# Patient Record
Sex: Male | Born: 1955 | Race: White | Hispanic: No | Smoking: Never smoker
Health system: Southern US, Community
[De-identification: ages and names within clinical notes are randomized; demographics above are authoritative.]

## PROBLEM LIST (undated history)

## (undated) DIAGNOSIS — M1990 Unspecified osteoarthritis, unspecified site: Secondary | ICD-10-CM

## (undated) DIAGNOSIS — E785 Hyperlipidemia, unspecified: Secondary | ICD-10-CM

## (undated) DIAGNOSIS — B20 Human immunodeficiency virus [HIV] disease: Secondary | ICD-10-CM

## (undated) DIAGNOSIS — I10 Essential (primary) hypertension: Secondary | ICD-10-CM

## (undated) DIAGNOSIS — Z21 Asymptomatic human immunodeficiency virus [HIV] infection status: Secondary | ICD-10-CM

## (undated) HISTORY — DX: Essential (primary) hypertension: I10

## (undated) HISTORY — DX: Hyperlipidemia, unspecified: E78.5

## (undated) HISTORY — PX: LUMBAR EPIDURAL INJECTION: SHX1980

## (undated) HISTORY — DX: Unspecified osteoarthritis, unspecified site: M19.90

## (undated) HISTORY — DX: Human immunodeficiency virus (HIV) disease: B20

## (undated) HISTORY — PX: WISDOM TOOTH EXTRACTION: SHX21

## (undated) HISTORY — DX: Asymptomatic human immunodeficiency virus (hiv) infection status: Z21

---

## 2000-01-01 ENCOUNTER — Ambulatory Visit (HOSPITAL_BASED_OUTPATIENT_CLINIC_OR_DEPARTMENT_OTHER): Admission: RE | Admit: 2000-01-01 | Discharge: 2000-01-01 | Payer: Self-pay | Admitting: Otolaryngology

## 2006-06-07 ENCOUNTER — Ambulatory Visit: Payer: Self-pay | Admitting: Family Medicine

## 2006-08-19 ENCOUNTER — Ambulatory Visit: Payer: Self-pay | Admitting: Family Medicine

## 2006-09-19 ENCOUNTER — Ambulatory Visit: Payer: Self-pay | Admitting: Family Medicine

## 2006-10-23 ENCOUNTER — Ambulatory Visit: Payer: Self-pay | Admitting: Family Medicine

## 2006-11-27 ENCOUNTER — Ambulatory Visit: Payer: Self-pay | Admitting: Family Medicine

## 2006-12-20 ENCOUNTER — Ambulatory Visit: Payer: Self-pay | Admitting: Internal Medicine

## 2007-05-05 ENCOUNTER — Ambulatory Visit: Payer: Self-pay | Admitting: Family Medicine

## 2007-11-03 ENCOUNTER — Ambulatory Visit: Payer: Self-pay | Admitting: Family Medicine

## 2008-05-04 ENCOUNTER — Ambulatory Visit: Payer: Self-pay | Admitting: Family Medicine

## 2008-06-03 ENCOUNTER — Ambulatory Visit: Payer: Self-pay | Admitting: Family Medicine

## 2008-06-03 ENCOUNTER — Encounter: Admission: RE | Admit: 2008-06-03 | Discharge: 2008-06-03 | Payer: Self-pay | Admitting: Family Medicine

## 2008-06-21 ENCOUNTER — Ambulatory Visit: Payer: Self-pay | Admitting: Internal Medicine

## 2008-07-05 ENCOUNTER — Ambulatory Visit: Payer: Self-pay | Admitting: Internal Medicine

## 2008-07-05 ENCOUNTER — Encounter: Payer: Self-pay | Admitting: Internal Medicine

## 2008-07-05 LAB — HM COLONOSCOPY: HM Colonoscopy: NORMAL

## 2008-07-07 ENCOUNTER — Encounter: Payer: Self-pay | Admitting: Internal Medicine

## 2009-06-07 ENCOUNTER — Ambulatory Visit: Payer: Self-pay | Admitting: Family Medicine

## 2009-06-09 ENCOUNTER — Ambulatory Visit: Payer: Self-pay | Admitting: Family Medicine

## 2009-07-11 ENCOUNTER — Ambulatory Visit: Payer: Self-pay | Admitting: Family Medicine

## 2009-11-28 ENCOUNTER — Ambulatory Visit: Payer: Self-pay | Admitting: Family Medicine

## 2009-12-01 ENCOUNTER — Ambulatory Visit: Payer: Self-pay | Admitting: Family Medicine

## 2009-12-05 ENCOUNTER — Emergency Department (HOSPITAL_COMMUNITY): Admission: EM | Admit: 2009-12-05 | Discharge: 2009-12-05 | Payer: Self-pay | Admitting: Emergency Medicine

## 2009-12-14 ENCOUNTER — Ambulatory Visit: Payer: Self-pay | Admitting: Family Medicine

## 2009-12-14 ENCOUNTER — Encounter: Payer: Self-pay | Admitting: Nurse Practitioner

## 2010-09-17 ENCOUNTER — Encounter: Payer: Self-pay | Admitting: Nurse Practitioner

## 2010-09-20 ENCOUNTER — Telehealth: Payer: Self-pay | Admitting: Internal Medicine

## 2010-09-20 ENCOUNTER — Encounter: Payer: Self-pay | Admitting: Nurse Practitioner

## 2010-09-20 ENCOUNTER — Ambulatory Visit: Payer: Self-pay | Admitting: Family Medicine

## 2010-09-21 ENCOUNTER — Ambulatory Visit: Payer: Self-pay | Admitting: Internal Medicine

## 2010-09-21 DIAGNOSIS — R143 Flatulence: Secondary | ICD-10-CM

## 2010-09-21 DIAGNOSIS — R142 Eructation: Secondary | ICD-10-CM

## 2010-09-21 DIAGNOSIS — D126 Benign neoplasm of colon, unspecified: Secondary | ICD-10-CM | POA: Insufficient documentation

## 2010-09-21 DIAGNOSIS — R141 Gas pain: Secondary | ICD-10-CM | POA: Insufficient documentation

## 2010-09-21 DIAGNOSIS — D649 Anemia, unspecified: Secondary | ICD-10-CM | POA: Insufficient documentation

## 2010-09-21 DIAGNOSIS — Z85038 Personal history of other malignant neoplasm of large intestine: Secondary | ICD-10-CM | POA: Insufficient documentation

## 2010-09-21 DIAGNOSIS — R21 Rash and other nonspecific skin eruption: Secondary | ICD-10-CM | POA: Insufficient documentation

## 2010-09-21 DIAGNOSIS — I10 Essential (primary) hypertension: Secondary | ICD-10-CM | POA: Insufficient documentation

## 2010-09-21 DIAGNOSIS — K649 Unspecified hemorrhoids: Secondary | ICD-10-CM | POA: Insufficient documentation

## 2010-09-21 DIAGNOSIS — N3010 Interstitial cystitis (chronic) without hematuria: Secondary | ICD-10-CM | POA: Insufficient documentation

## 2010-09-21 DIAGNOSIS — R195 Other fecal abnormalities: Secondary | ICD-10-CM | POA: Insufficient documentation

## 2010-09-25 ENCOUNTER — Ambulatory Visit: Payer: Self-pay | Admitting: Internal Medicine

## 2010-09-25 DIAGNOSIS — K2900 Acute gastritis without bleeding: Secondary | ICD-10-CM | POA: Insufficient documentation

## 2010-09-25 LAB — CONVERTED CEMR LAB
Basophils Absolute: 0 10*3/uL (ref 0.0–0.1)
Basophils Relative: 0.5 % (ref 0.0–3.0)
Eosinophils Absolute: 0.3 10*3/uL (ref 0.0–0.7)
Eosinophils Relative: 6.9 % — ABNORMAL HIGH (ref 0.0–5.0)
Ferritin: 302.3 ng/mL (ref 22.0–322.0)
Folate: 13.7 ng/mL
HCT: 27.2 % — ABNORMAL LOW (ref 39.0–52.0)
Hemoglobin: 9.5 g/dL — ABNORMAL LOW (ref 13.0–17.0)
Iron: 56 ug/dL (ref 42–165)
Lymphocytes Relative: 21 % (ref 12.0–46.0)
Lymphs Abs: 1 10*3/uL (ref 0.7–4.0)
MCHC: 34.9 g/dL (ref 30.0–36.0)
MCV: 86.8 fL (ref 78.0–100.0)
Monocytes Absolute: 0.5 10*3/uL (ref 0.1–1.0)
Monocytes Relative: 10.5 % (ref 3.0–12.0)
Neutro Abs: 2.9 10*3/uL (ref 1.4–7.7)
Neutrophils Relative %: 61.1 % (ref 43.0–77.0)
Platelets: 170 10*3/uL (ref 150.0–400.0)
RBC: 3.14 M/uL — ABNORMAL LOW (ref 4.22–5.81)
RDW: 14.6 % (ref 11.5–14.6)
UREASE: NEGATIVE
Vitamin B-12: 311 pg/mL (ref 211–911)
WBC: 4.7 10*3/uL (ref 4.5–10.5)

## 2010-09-29 ENCOUNTER — Encounter (INDEPENDENT_AMBULATORY_CARE_PROVIDER_SITE_OTHER): Payer: Self-pay | Admitting: *Deleted

## 2010-10-02 ENCOUNTER — Ambulatory Visit: Payer: Self-pay | Admitting: Internal Medicine

## 2010-10-10 ENCOUNTER — Ambulatory Visit: Payer: Self-pay | Admitting: Internal Medicine

## 2010-10-11 ENCOUNTER — Ambulatory Visit: Payer: Self-pay | Admitting: Oncology

## 2010-10-11 ENCOUNTER — Ambulatory Visit: Payer: Self-pay | Admitting: Family Medicine

## 2010-10-25 ENCOUNTER — Telehealth: Payer: Self-pay | Admitting: Internal Medicine

## 2010-12-14 NOTE — Miscellaneous (Signed)
Summary: labs  Clinical Lists Changes  Orders: Added new Test order of TLB-B12, Serum-Total ONLY [phone]) - Signed Added new Test order of TLB-Ferritin (82728-FER) - Signed Added new Test order of TLB-Folic Acid (Folate) (82746-FOL) - Signed Added new Test order of TLB-Iron, (Fe) Total (83540-FE) - Signed Added new Test order of TLB-CBC Platelet - w/Differential (85025-CBCD) - Signed

## 2010-12-14 NOTE — Procedures (Signed)
Summary: Colonoscopy  Patient: Dale Watkins Note: All result statuses are Final unless otherwise noted.  Tests: (1) Colonoscopy (COL)   COL Colonoscopy           DONE     [facility]     [address]     [place], [place]  [address]           COLONOSCOPY PROCEDURE REPORT           PATIENT:  Dale Watkins, Dale Watkins  MR#:  [id number]     BIRTHDATE:  1955/11/26, 54 yrs. old  GENDER:  male     ENDOSCOPIST:  Wilhemina Bonito. Eda Keys, MD     REF. BY:  Office     PROCEDURE DATE:  10/10/2010     PROCEDURE:  Diagnostic Colonoscopy     ASA CLASS:  Class II     INDICATIONS:  Anemia, heme positive stool, history of     pre-cancerous (adenomatous) colon polyps ; index colonoscopy     06-2008 w/ small T.A. RECENT LABS WITH HG 9.5 (MCV 86.8).  NORMAL     B12,FOLATE, AND IRON STUIES (FERRITIN LEVEL 302). ALSO ESR     99...Marland KitchenRECENT EGD W/ EROSIVE GASTRITIS     MEDICATIONS:   Fentanyl 100 mcg IV, Versed 9 mg IV, Benadryl 12.5     mg IV           DESCRIPTION OF PROCEDURE:   After the risks benefits and     alternatives of the procedure were thoroughly explained, informed     consent was obtained.  Digital rectal exam was performed and     revealed no abnormalities.   The LB CF-H180AL [id number] endoscope     was introduced through the anus and advanced to the cecum, which     was identified by both the appendix and ileocecal valve, without     limitations. Time to cecum = 1:52 min.The quality of the prep was     excellent, using MoviPrep.  The instrument was then slowly     withdrawn (time = 6:00 min) as the colon was fully examined.     <<PROCEDUREIMAGES>>           FINDINGS:  A normal appearing cecum, ileocecal valve, and     appendiceal orifice were identified. The ascending, hepatic     flexure, transverse, splenic flexure, descending, sigmoid colon,     and rectum appeared unremarkable.  The terminal ileum was deeply     intubated and appeared normal.   Retroflexed views in the rectum     revealed no  abnormalities.    The scope was then withdrawn from     the patient and the procedure completed.           COMPLICATIONS:  None     ENDOSCOPIC IMPRESSION:     1) Normal colon     2) Normal terminal ileum     3) NOT LIKELY THAT THE DEGREE OF ANEMIA EXPLAINED BY GI BLOOD LOSS           RECOMMENDATIONS:     1) Follow up colonoscopy in 5 years (Hx adenoma 2009)     2) MY OFFICE TO ARRANGE HEMATOLOGY EVALUATION " PERSISTING     NORMOCYTIC ANEMIA OF UNCERTAIN CAUSE"     3) RESUME GENERAL MEDICAL CARE W/ DR Susann Givens           ______________________________     Wilhemina Bonito. Eda Keys, MD  CC:  Sharlot Gowda, MD; The Patient           n.     eSIGNED:   Wilhemina Bonito. Eda Keys at 10/10/2010 09:36 AM           Allene Dillon, [id number]  Note: An exclamation mark (!) indicates a result that was not dispersed into the flowsheet. Document Creation Date: 10/10/2010 9:37 AM _______________________________________________________________________  (1) Order result status: Final Collection or observation date-time: 10/10/2010 09:25 Requested date-time:  Receipt date-time:  Reported date-time:  Referring Physician:   Ordering Physician: Fransico Setters [phone]) Specimen Source:  Source: Launa Grill Order Number: [id number] Lab site:   Appended Document: Colonoscopy    Clinical Lists Changes  Observations: Added new observation of COLONNXTDUE: 09/2015 (10/10/2010 13:23)

## 2010-12-14 NOTE — Miscellaneous (Signed)
Summary: previsit prep/rm  Clinical Lists Changes  Medications: Added new medication of MOVIPREP 100 GM  SOLR (PEG-KCL-NACL-NASULF-NA ASC-C) As per prep instructions. - Signed Rx of MOVIPREP 100 GM  SOLR (PEG-KCL-NACL-NASULF-NA ASC-C) As per prep instructions.;  #1 x 0;  Signed;  Entered by: Sherren Kerns RN;  Authorized by: Hilarie Fredrickson MD;  Method used: Electronically to [facility] [address], [place], [place]  [address], Ph: [phone], Fax: [phone] Observations: Added new observation of ALLERGY REV: Done (10/02/2010 10:09)    Prescriptions: MOVIPREP 100 GM  SOLR (PEG-KCL-NACL-NASULF-NA ASC-C) As per prep instructions.  #1 x 0   Entered by:   Sherren Kerns RN   Authorized by:   Hilarie Fredrickson MD   Signed by:   Sherren Kerns RN on 10/02/2010   Method used:   Electronically to        [facility]* (retail)       [address]       [place], [place]  [address]       Ph: [phone]       Fax: [phone]   RxID:   [id number]

## 2010-12-14 NOTE — Letter (Signed)
Summary: EGD Instructions  [facility]  [address] [place], [place] [address]   Phone: [phone]  Fax: [phone]       Dale Watkins    July 01, 1956    MRN: [id number]       Procedure Day /Date: 09-25-10     Arrival Time: 9:30 AM      Procedure Time:10:30 AM     Location of Procedure:                    X      [facility] (4th Floor)   PREPARATION FOR ENDOSCOPY   On 09-25-10 THE DAY OF THE PROCEDURE:  1.   No solid foods, milk or milk products are allowed after midnight the night before your procedure.  2.   Do not drink anything colored red or purple.  Avoid juices with pulp.  No orange juice.  3.  You may drink clear liquids until 8:30 AM  which is 2 hours before your procedure.                                                                                                CLEAR LIQUIDS INCLUDE: Water Jello Ice Popsicles Tea (sugar ok, no milk/cream) Powdered fruit flavored drinks Coffee (sugar ok, no milk/cream) Gatorade Juice: apple, white grape, white cranberry  Lemonade Clear bullion, consomm, broth Carbonated beverages (any kind) Strained chicken noodle soup Hard Candy   MEDICATION INSTRUCTIONS  Unless otherwise instructed, you should take regular prescription medications with a small sip of water as early as possible the morning of your procedure.            OTHER INSTRUCTIONS  You will need a responsible adult at least 54 years of age to accompany you and drive you home.   This person must remain in the waiting room during your procedure.  Wear loose fitting clothing that is easily removed.  Leave jewelry and other valuables at home.  However, you may wish to bring a book to read or an iPod/MP3 player to listen to music as you wait for your procedure to start.  Remove all body piercing jewelry and leave at home.  Total time from sign-in until discharge is approximately 2-3 hours.  You should go home directly  after your procedure and rest.  You can resume normal activities the day after your procedure.  The day of your procedure you should not:   Drive   Make legal decisions   Operate machinery   Drink alcohol   Return to work  You will receive specific instructions about eating, activities and medications before you leave.    The above instructions have been reviewed and explained to me by   _______________________    I fully understand and can verbalize these instructions _____________________________ Date _________

## 2010-12-14 NOTE — Miscellaneous (Signed)
Summary: Orders Update clo  Clinical Lists Changes  Problems: Added new problem of GASTRITIS, ACUTE W/O HEMORRHAGE (ICD-535.00) Orders: Added new Test order of TLB-H Pylori Screen Gastric Biopsy (83013-CLOTEST) - Signed

## 2010-12-14 NOTE — Letter (Signed)
Summary: [facility] Instructions  [facility]  [address] [place], [place] [address]   Phone: [phone]  Fax: [phone]       Dale Watkins    [birth year]    MRN: [id number]        Procedure Day Dorna Bloom:  Jake Shark  10/10/10     Arrival Time:  8:00AM     Procedure Time:  9:00AM     Location of Procedure:                    Juliann Pares  [facility] (4th Floor)                      PREPARATION FOR COLONOSCOPY WITH MOVIPREP   Starting 5 days prior to your procedure 10/05/10 do not eat nuts, seeds, popcorn, corn, beans, peas,  salads, or any raw vegetables.  Do not take any fiber supplements (e.g. Metamucil, Citrucel, and Benefiber).  THE DAY BEFORE YOUR PROCEDURE         DATE: 10/09/10  DAY: MONDAY  1.  Drink clear liquids the entire day-NO SOLID FOOD  2.  Do not drink anything colored red or purple.  Avoid juices with pulp.  No orange juice.  3.  Drink at least 64 oz. (8 glasses) of fluid/clear liquids during the day to prevent dehydration and help the prep work efficiently.  CLEAR LIQUIDS INCLUDE: Water Jello Ice Popsicles Tea (sugar ok, no milk/cream) Powdered fruit flavored drinks Coffee (sugar ok, no milk/cream) Gatorade Juice: apple, white grape, white cranberry  Lemonade Clear bullion, consomm, broth Carbonated beverages (any kind) Strained chicken noodle soup Hard Candy                             4.  In the morning, mix first dose of MoviPrep solution:    Empty 1 Pouch A and 1 Pouch B into the disposable container    Add lukewarm drinking water to the top line of the container. Mix to dissolve    Refrigerate (mixed solution should be used within 24 hrs)  5.  Begin drinking the prep at 5:00 p.m. The MoviPrep container is divided by 4 marks.   Every 15 minutes drink the solution down to the next mark (approximately 8 oz) until the full liter is complete.   6.  Follow completed prep with 16 oz of clear liquid of your choice (Nothing  red or purple).  Continue to drink clear liquids until bedtime.  7.  Before going to bed, mix second dose of MoviPrep solution:    Empty 1 Pouch A and 1 Pouch B into the disposable container    Add lukewarm drinking water to the top line of the container. Mix to dissolve    Refrigerate  THE DAY OF YOUR PROCEDURE      DATE: 10/10/10    DAY: TUESDAY  Beginning at 4:00AM (5 hours before procedure):         1. Every 15 minutes, drink the solution down to the next mark (approx 8 oz) until the full liter is complete.  2. Follow completed prep with 16 oz. of clear liquid of your choice.    3. You may drink clear liquids until 7:00AM (2 HOURS BEFORE PROCEDURE).   MEDICATION INSTRUCTIONS  Unless otherwise instructed, you should take regular prescription medications with a small sip of water   as early as possible the morning  of your procedure.           OTHER INSTRUCTIONS  You will need a responsible adult at least 54 years of age to accompany you and drive you home.   This person must remain in the waiting room during your procedure.  Wear loose fitting clothing that is easily removed.  Leave jewelry and other valuables at home.  However, you may wish to bring a book to read or  an iPod/MP3 player to listen to music as you wait for your procedure to start.  Remove all body piercing jewelry and leave at home.  Total time from sign-in until discharge is approximately 2-3 hours.  You should go home directly after your procedure and rest.  You can resume normal activities the  day after your procedure.  The day of your procedure you should not:   Drive   Make legal decisions   Operate machinery   Drink alcohol   Return to work  You will receive specific instructions about eating, activities and medications before you leave.    The above instructions have been reviewed and explained to me by   Sherren Kerns RN  October 02, 2010 10:23 AM    I fully understand  and can verbalize these instructions _____________________________ Date _________

## 2010-12-14 NOTE — Procedures (Signed)
Summary: Colonoscopy   Colonoscopy  Procedure date:  07/05/2008  Findings:      Location:  [facility].    Procedures Next Due Date:    Colonoscopy: 07/2013  Patient Name: Dale Watkins, Dale Watkins MRN:  Procedure Procedures: Colonoscopy CPT: [address].    with polypectomy. CPT: A3573898.  Personnel: Endoscopist: Dale Bonito. Marina Goodell, MD.  Referred By: Dale All Susann Givens, MD.  Exam Location: Exam performed in Outpatient Clinic. Outpatient  Patient Consent: Procedure, Alternatives, Risks and Benefits discussed, consent obtained, from patient. Consent was obtained by the RN.  Indications  Average Risk Screening Routine.  History  Current Medications: Patient is not currently taking Coumadin.  Pre-Exam Physical: Performed Jul 05, 2008. Cardio-pulmonary exam, Rectal exam, HEENT exam , Abdominal exam, Mental status exam WNL.  Comments: Pt. history reviewed/updated, physical exam performed prior to initiation of sedation?yes Exam Exam: Extent of exam reached: Cecum, extent intended: Cecum.  The cecum was identified by appendiceal orifice and IC valve. Patient position: on left side. Time to Cecum: 00:01:54. Time for Withdrawl: 00:10:40. Colon retroflexion performed. Images taken. ASA Classification: II. Tolerance: excellent.  Monitoring: Pulse and BP monitoring, Oximetry used. Supplemental O2 given.  Colon Prep Used Movi prep for colon prep. Prep results: excellent.  Sedation Meds: Patient assessed and found to be appropriate for moderate (conscious) sedation. Fentanyl 100 mcg. given IV. Versed 10 mg. given IV.  Findings NORMAL EXAM: Cecum to Rectum.  POLYP: Sigmoid Colon, Maximum size: 2 mm. Procedure:  snare without cautery, removed, retrieved, Polyp sent to pathology. ICD9: Colon Polyps: 211.3.   Assessment  Diagnoses: 211.3: Colon Polyps.  one.   Events  Unplanned Interventions: No intervention was required.  Unplanned Events: There were no complications. Plans  Disposition: After procedure patient sent to recovery. After recovery patient sent home.  Scheduling/Referral: Colonoscopy, to Dale Bonito. Marina Goodell, MD, in 5 years if polyp adenomatous; otherwise 10 years,     cc.   Dale Ruiz C. LaLonde,MD   REPORT OF SURGICAL PATHOLOGY   Case #: [id number] Patient Name: Dale Watkins Office Chart Number:  [id number]   MRN: [id number] Pathologist: Dale Gandy. Luisa Hart, MD DOB/Age  06/08/1956 (Age: 55)    Gender: M Date Taken:  07/05/2008 Date Received: 07/06/2008   FINAL DIAGNOSIS   ***MICROSCOPIC EXAMINATION AND DIAGNOSIS***   COLON, SIGMOID POLYP:  TUBULAR ADENOMA.  NO HIGH GRADE DYSPLASIA OR MALIGNANCY IDENTIFIED.   cc Date Reported:  07/07/2008     Dale Gandy. Luisa Hart, MD *** Electronically Signed Out By Dale Watkins ***   Clinical information Screening.  R/O adenoma (mj)   specimen(s) obtained Colon, polyp(s), sigmoid   Gross Description Received in formalin is a tan, soft tissue fragment that is submitted in toto.  Size:  0.2 cm   (GP:mw, 07/06/08)    mw/     Signed by Dale Fredrickson MD on 07/07/2008 at 1:46 PM  ________________________________________________________________________ recall colonoscopy in 5 years   Signed by Dale Fredrickson MD on 07/07/2008 at 1:46 PM  ________________________________________________________________________    July 07, 2008 MRN: [id number]    [facility] [place] [address] [place], [place]  [address]    Dear Mr. Dale Watkins,  I am pleased to inform you that the colon polyp(s) removed during your recent colonoscopy was (were) found to be benign (no cancer detected) upon pathologic examination.  I recommend you have a repeat colonoscopy examination in 5 years to look for recurrent polyps, as having colon polyps increases your risk for having recurrent polyps or even colon cancer  in the future.  Should you develop new or worsening symptoms of abdominal pain, bowel habit changes or bleeding from the rectum or  bowels, please schedule an evaluation with either your primary care physician or with me.  Additional information/recommendations:  _X._ No further action with gastroenterology is needed at this time. Please      follow-up with your primary care physician for your other healthcare      needs.  Please call us if you are having persistent problems or have questions about your condition that have not been fully answered at this time.  Sincerely,  Dale Fredrickson MD  This letter has been electronically signed by your physician.   Signed by Dale Fredrickson MD on 07/07/2008 at 1:46 PM   This report was created from the original endoscopy report, which was reviewed and signed by the above listed endoscopist.    ________________________________________________________________________

## 2010-12-14 NOTE — Miscellaneous (Signed)
Summary: GI PV  Clinical Lists Changes  Medications: Added new medication of MOVIPREP 100 GM  SOLR (PEG-KCL-NACL-NASULF-NA ASC-C) As per prep instructions. - Signed Rx of MOVIPREP 100 GM  SOLR (PEG-KCL-NACL-NASULF-NA ASC-C) As per prep instructions.;  #1 x 0;  Signed;  Entered by: Barton Fanny RN;  Authorized by: Hilarie Fredrickson MD;  Method used: Electronic Observations: Added new observation of NKA: T (06/21/2008 11:42)  Patient Information Guide reviewed and given to pt.  Prescriptions: MOVIPREP 100 GM  SOLR (PEG-KCL-NACL-NASULF-NA ASC-C) As per prep instructions.  #1 x 0   Entered by:   Barton Fanny RN   Authorized by:   Hilarie Fredrickson MD   Signed by:   Barton Fanny RN on 06/21/2008   Method used:   Electronically sent to ...       [facility]       [address]       [place], [place]  [address]       Ph: [phone]       Fax: [phone]   RxID:   [id number]

## 2010-12-14 NOTE — Letter (Signed)
Summary: 12-14-09 through 09-20-10 / [facility]  12-14-09 through 09-20-10 / [facility]   Imported By: Lennie Odor 09/29/2010 15:58:49  _____________________________________________________________________  External Attachment:    Type:   Image     Comment:   External Document

## 2010-12-14 NOTE — Progress Notes (Signed)
Summary: Triage  Phone Note From Other Clinic   Caller: [facility] @ Dr. Leanna Battles  [phone] Call For: Dr. Marina Goodell Summary of Call: Possible G.I bleed....requesting sooner appt. than next avail. Initial call taken by: Karna Christmas,  September 20, 2010 1:43 PM  Follow-up for Phone Call        Offered appt. with N.P. for tomorrow.She will consult  Dr.LaLonde and call back,  Teryl Lucy RN  September 20, 2010 3:02 PM Dr.Lalonde would like pt. seen tomorrow-given appt. with N.P. as Dr.Perry is supervising dr.Records will be faxed to [facility] in a.m. Follow-up by: Teryl Lucy RN,  September 20, 2010 3:45 PM

## 2010-12-14 NOTE — Assessment & Plan Note (Signed)
Summary: Poss. G.I. bleed (Dr. Marina Goodell Pt)   History of Present Illness Visit Type: Initial Consult Primary GI MD: Yancey Flemings MD Primary Provider: Ronnald Nian, MD Requesting Provider: Ronnald Nian, MD Chief Complaint: Heme positive stool, hemorrhoids, and bloating, Pt states that he has not seen any blood but has seen mucus a couple of times with BMs  History of Present Illness:   Patient is a 55 year old male seen Aug. 2009 by Dr. Marina Goodell for colon cancer screening. He is referred here for evaluation of anemia and heme positive stools. Recently treated with Prednisone for generalized rash, got better but rash recurred and patient treated with another round of Prednisone. Last weekend woke up with "rash" on his tongue. PCP's office was closed so patient went to Urgent Care. Apparently told he didn't have thrush. Labs done and showed ESR of 99 and hgb of  8.8. Hemoccult positive on exam. Patient saw PCP following Monday. Repeat hgb was 10.4. In Feb. of this year hgb was 15.5. Patient had the flu in January. He lost 20 pounds during that time but regained most of the weight. No abdominal pain, mild nausea for last two days.   GI Review of Systems    Reports nausea.      Denies abdominal pain, acid reflux, belching, bloating, chest pain, dysphagia with liquids, dysphagia with solids, heartburn, loss of appetite, vomiting, vomiting blood, weight loss, and  weight gain.      Reports heme positive stool.     Denies anal fissure, black tarry stools, change in bowel habit, constipation, diarrhea, diverticulosis, fecal incontinence, hemorrhoids, irritable bowel syndrome, jaundice, light color stool, liver problems, rectal bleeding, and  rectal pain. ++++++++++++++++++++++----------------------------   Current Medications (verified): 1)  Lisinopril 10 Mg Tabs (Lisinopril) .... One Tablet By Mouth Once Daily 2)  Ambien 10 Mg Tabs (Zolpidem Tartrate) .... One Tablet By Mouth Once Daily 3)  Triamcinolone  Acetonide 0.1 % Crea (Triamcinolone Acetonide) .... 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  Alcohol Use - no Daily Caffeine Use: 2 daily  Illicit Drug Use - no Smoking Status:  quit Drug Use:  no  Review of Systems       The patient complains of fatigue, itching, skin rash, swollen lymph glands, and voice change.  The patient denies allergy/sinus, anemia, anxiety-new, arthritis/joint pain, back pain, blood in urine, breast changes/lumps, change in vision, confusion, cough, coughing up blood, depression-new, fainting, fever, headaches-new, hearing problems, heart murmur, heart rhythm changes, menstrual pain, muscle pains/cramps, night sweats, nosebleeds, pregnancy symptoms, shortness of breath, sleeping problems, sore throat, swelling of feet/legs, thirst - excessive , urination - excessive , urination changes/pain, urine leakage, and vision changes.    Vital Signs:  Patient profile:   55 year old male Height:      68 inches Weight:      169 pounds BMI:     25.79 BSA:     1.90 Pulse rate:   88 / minute Pulse rhythm:   regular BP sitting:   128 / 64  (left arm) Cuff size:   regular  Vitals Entered By: Ok Anis CMA (September 21, 2010 2:15 PM)  Physical Exam  General:  Well developed, well nourished, no acute distress. Head:  Normocephalic and atraumatic. Eyes:  Conjunctiva pink, no icterus.  Mouth:  No oral lesions. Tongue moist.  Neck:  no obvious masses  Lungs:  Clear throughout to auscultation. Heart:  Regular rate and rhythm; no murmurs, rubs,  or bruits. Abdomen:  Abdomen soft, nontender,  nondistended. No obvious masses or hepatomegaly.Normal bowel sounds.  Rectal:  No external or internal lesions appreciated. Stool medium brown, heme positive Msk:  Symmetrical with no gross deformities. Normal posture. Extremities:  No palmar erythema, no edema.  Neurologic:  Alert and  oriented x4;  grossly normal neurologically. Skin:  Intact without significant lesions or rashes. Cervical Nodes:  No significant cervical adenopathy. Psych:  Alert and cooperative. Normal mood and affect.   Impression & Recommendations:  Problem # 1:  BLOOD IN STOOL, OCCULT (ICD-792.1) Assessment New Positive fecal occult blood with drop in hemoglobin from 15.5 to 10.4 sometime between Feb.and now. No GI symptoms. He does take Aleve, almost daily.  Patient is up to date on CRC screening.  Patient is at risk of erosive disease / PUD secondary to NSAID use. Patient looks fine today. Abdominal exam is benign. Stool, though heme positive, is brown. He has no chest pain, SOB or dizziness. For further evaluation the patient will be scheduled for an EGD with biopsies (if indicated).  The risks and benefits of the procedure, as well as alternatives were discussed with the patient and he agrees to proceed. Hold Aleve until results of EGD are known. Start daily PPI.  MAY NEED REPEAT COLONOSCOPY NOW IF EGD NOT DEFINITIVE  Orders: EGD (EGD)  Problem # 2:  ADENOMATOUS COLONIC POLYP (ICD-211.3) Assessment: Comment Only Next colonoscopy due 2014  Problem # 3:  SKIN RASH (ICD-782.1) Generalized rash, recent steroid course x2. Recently changed washing detergents but patient doesn't think that is the problem. Of note, patient has a ESR of 99.   Patient Instructions: 1)  We schedueld the Endoscopy with Dr. Yancey Flemings on 09-25-10. 2)  Directions and brochure provided. 3)  [facility] Patient Information Guide given to patient. 4)  We have given you samples of Nexium, take 1 capsule 30 min before  breakfast. 5)  Copy sent to : John C. Susann Givens, MD 6)  The medication list was reviewed and reconciled.  All changed / newly prescribed medications were explained.  A complete medication list was provided to the patient / caregiver.

## 2010-12-14 NOTE — Procedures (Signed)
Summary: Upper Endoscopy  Patient: Dale Watkins Note: All result statuses are Final unless otherwise noted.  Tests: (1) Upper Endoscopy (EGD)   EGD Upper Endoscopy       DONE     [facility]     [address]     [place], [place]  [address]           ENDOSCOPY PROCEDURE REPORT           PATIENT:  [name], [name]  MR#:  [id number]     BIRTHDATE:  08/26/1956, 53 yrs. old  GENDER:  male           ENDOSCOPIST:  Wilhemina Bonito. Eda Keys, MD     Referred by:  Office           PROCEDURE DATE:  09/25/2010     PROCEDURE:  EGD with biopsy, [id number]     ASA CLASS:  Class II     INDICATIONS:  anemia, hemoccult positive stool           MEDICATIONS:   Fentanyl 75 mcg IV, Versed 9 mg IV     TOPICAL ANESTHETIC:  Exactacain Spray           DESCRIPTION OF PROCEDURE:   After the risks benefits and     alternatives of the procedure were thoroughly explained, informed     consent was obtained.  The LB GIF-H180 [id number] endoscope was     introduced through the mouth and advanced to the third portion of     the duodenum, without limitations.  The instrument was slowly     withdrawn as the mucosa was fully examined.     <<PROCEDUREIMAGES>>           Mild Esophagitis was found in the distal esophagus.  Moderate     erosive gastritis was found in the body and the antrum of the     stomach. Clo bx taken. The duodenal bulb was normal in appearance,     as was the postbulbar duodenum to D3.    Retroflexed views     revealed no abnormalities.    The scope was then withdrawn from     the patient and the procedure completed.           COMPLICATIONS:  None           ENDOSCOPIC IMPRESSION:     1) Mild Esophagitis in the distal esophagus     2) Moderate erosive gastritis in the body and the antrum of the     stomach (possible cause for anemia and heme + stool)     3) Normal duodenum     RECOMMENDATIONS:     1) Continue Nexium     2) Avoid NSAIDS  if at all possible     3) My office will schedule a  colonoscopy to further evaluate     anemia and heme + stool     4) CBC, B12, Folate, Ferritin, Iron saturation           ______________________________     Wilhemina Bonito. Eda Keys, MD           CC:  Sharlot Gowda, MD, The Patient           n.     eSIGNED:   Wilhemina Bonito. Eda Keys at 09/25/2010 11:22 AM           Allene Dillon, [id number]  Note: An exclamation mark (!) indicates a result that  was not dispersed into the flowsheet. Document Creation Date: 09/25/2010 11:23 AM _______________________________________________________________________  (1) Order result status: Final Collection or observation date-time: 09/25/2010 11:13 Requested date-time:  Receipt date-time:  Reported date-time:  Referring Physician:   Ordering Physician: Fransico Setters [id number]) Specimen Source:  Source: Launa Grill Order Number: [id number] Lab site:

## 2010-12-14 NOTE — Miscellaneous (Signed)
Summary: Referral to Hematology  Clinical Lists Changes  Orders: Added new Test order [facility] - Signed 

## 2010-12-14 NOTE — Letter (Signed)
Summary: Patient Notice- Polyp Results  [facility]  [address] [place], [place] [address]   Phone: [phone]  Fax: [phone]        July 07, 2008 MRN: [id number]    [facility] [address] [place], [place]  [address]    Dear Dale Watkins,  I am pleased to inform you that the colon polyp(s) removed during your recent colonoscopy was (were) found to be benign (no cancer detected) upon pathologic examination.  I recommend you have a repeat colonoscopy examination in 5 years to look for recurrent polyps, as having colon polyps increases your risk for having recurrent polyps or even colon cancer in the future.  Should you develop new or worsening symptoms of abdominal pain, bowel habit changes or bleeding from the rectum or bowels, please schedule an evaluation with either your primary care physician or with me.  Additional information/recommendations:  _X._ No further action with gastroenterology is needed at this time. Please      follow-up with your primary care physician for your other healthcare      needs.  Please call us if you are having persistent problems or have questions about your condition that have not been fully answered at this time.  Sincerely,  Hilarie Fredrickson MD  This letter has been electronically signed by your physician.

## 2010-12-14 NOTE — Progress Notes (Signed)
Summary: Pt refused appt w/Hematologist  Phone Note From Other Clinic   Caller: Renee @ [facility] Call For: Dr Marina Goodell Summary of Call: She called pt to try to set him up with Hematologist. Patient adviced her that after his visit with Dr Marina Goodell he went to see him Primary Care Physician and his Physician adviced him he did not need to see a Hematologist. Renee adviced pt to call our office and tell Dr Marina Goodell but she still wanted to let you know just in case pt doesnt call. Initial call taken by: Leanor Kail [facility],  October 25, 2010 1:10 PM  Follow-up for Phone Call        above-noted. Please fax this correspondence to the patient's primary care physician. Follow-up by: Hilarie Fredrickson MD,  October 25, 2010 1:53 PM

## 2011-01-29 LAB — CBC
HCT: 45.7 % (ref 39.0–52.0)
Hemoglobin: 15.5 g/dL (ref 13.0–17.0)
MCHC: 34 g/dL (ref 30.0–36.0)
MCV: 87.3 fL (ref 78.0–100.0)
Platelets: 221 10*3/uL (ref 150–400)
RBC: 5.23 MIL/uL (ref 4.22–5.81)
RDW: 13.2 % (ref 11.5–15.5)
WBC: 5.7 10*3/uL (ref 4.0–10.5)

## 2011-01-29 LAB — DIFFERENTIAL
Basophils Absolute: 0 10*3/uL (ref 0.0–0.1)
Basophils Relative: 1 % (ref 0–1)
Eosinophils Absolute: 0 10*3/uL (ref 0.0–0.7)
Eosinophils Relative: 0 % (ref 0–5)
Lymphocytes Relative: 31 % (ref 12–46)
Lymphs Abs: 1.7 10*3/uL (ref 0.7–4.0)
Monocytes Absolute: 0.9 10*3/uL (ref 0.1–1.0)
Monocytes Relative: 16 % — ABNORMAL HIGH (ref 3–12)
Neutro Abs: 3.1 10*3/uL (ref 1.7–7.7)
Neutrophils Relative %: 53 % (ref 43–77)

## 2011-01-29 LAB — BASIC METABOLIC PANEL
BUN: 25 mg/dL — ABNORMAL HIGH (ref 6–23)
CO2: 29 mEq/L (ref 19–32)
Calcium: 8.5 mg/dL (ref 8.4–10.5)
Chloride: 97 mEq/L (ref 96–112)
Creatinine, Ser: 0.96 mg/dL (ref 0.4–1.5)
GFR calc Af Amer: >60 mL/min (ref >60)
GFR calc non Af Amer: >60 mL/min (ref >60)
Glucose, Bld: 96 mg/dL (ref 70–99)
Potassium: 3.8 mEq/L (ref 3.5–5.1)
Sodium: 136 mEq/L (ref 135–145)

## 2011-03-30 NOTE — Assessment & Plan Note (Signed)
[facility]                             PULMONARY OFFICE NOTE   Dale Watkins, Dale Watkins                       MRN:          [id number]  DATE:12/20/2006                            DOB:          January 29, 1956    PROBLEM:  A 55 year old man referred through the courtesy of Dr. Susann Givens  in sleep medicine consultation.  Concerned because of short sleep and  insomnia.   HISTORY:  He says since childhood he has never been a long sleeper.  A  fairly long, sustained sleep interval is 4-6 hours for him.  He had  tried a variety of over-the-counter medications in the past.  At one  point, he tried using alcohol which was unsatisfactory and he has  stopped that.  Antidepressants have not made him feel well.  He began  trying Lunesta when it became available, but could not tolerate the  aftertaste.  Ambien CR worked well, providing sustained 7-8 hours of  comfortable sleep, but is too expensive.  He has been using 10 mg  generic Ambien with which he usually gets 7-8 hours of sleep, and says  this is a much better quality of life than using no medication.  Without  it, he will lie awake for hours staring at the wall.  Usual bedtime is  between 10 and 11 p.m., estimating 3 hours sleep latency, and waking  every hour through the night before finally up between 5 and 6 a.m.  He  has had no adverse effects from Ambien, no sleepwalking or unusual  behaviors.  He sleeps alone, and is not sure about current snoring  status.  He feels unrested and somewhat uncomfortable, but not sleepy  during the daytime.   MEDICATION:  1. Ramipril 10 mg.  2. Hydrochlorothiazide 12.5 mg.  3. Verapamil SR 180 mg b.i.d.  4. Zolpidem 10 mg at h.s.   No medication allergy.   REVIEW OF SYSTEMS:  As per HPI.  Not aware of snoring or leg jerks.  He  denies physical discomfort lying in bed.   PAST HISTORY:  Hypertension.  Skull fracture in 6th grade, because of  which he was observed in an  intensive care unit for 2 days without  surgery.  He had fallen against a curb.  He became depressed after a  long-term relationship ended, and took several different  antidepressants, all of which caused malaise.  No surgeries.   SOCIAL HISTORY:  Not married, no children.  Quit smoking 1996, quit  alcohol November 2007.  Works as an Airline pilot.   FAMILY HISTORY:  Mother poor sleeper.   OBJECTIVE:  Weight 177 pounds, BP 132/82, pulse regular 58, room air  saturation 97%.  Medium build, alert.  HEENT:  Palate spacing 2/4, nose is not obstructed, voice quality is  normal.  There is no neck vein distension, thyromegaly or stridor.  CHEST:  Quiet.  Clear lung fields.  HEART:  Regular rhythm, no murmur or gallop.  EXTREMITIES:  No restlessness or tremor.   IMPRESSION:  Chronic insomnia.  The question is whether there is a  process  disturbing sleep, not recognized by him since he sleeps alone,  and in particular before depending long term on insomnia therapies we  want to exclude sleep apnea.  This was discussed with him, and is  consistent with his desire for this evaluation.   PLAN:  1. We have discussed basics of sleep hygiene and insomnia management,      including alternatives to therapy, including providing a web site      for cognitive behavioral therapy.  2. Refill Ambien 10 mg 1 nightly p.r.n. for use as per guidance.  3. We are scheduling a nocturnal polysomnogram with split protocol at      the [facility], and he will return after that is      completed.   I appreciate the chance to meet him.     Clinton D. Maple Hudson, MD, Tonny Bollman, FACP  Electronically Signed    CDY/MedQ  DD: 12/21/2006  DT: 12/21/2006  Job #: [id number]   cc:   Sharlot Gowda, M.D.  [facility]

## 2011-03-30 NOTE — Op Note (Signed)
[facility]  Patient:    Dale Watkins, Dale Watkins                         MRN: [id number] Proc. Date: 01/01/00 Adm. Date:  [id number] Attending:  Serena Colonel H CC:         Fayne Norrie, M.D.                           Operative Report  PREOPERATIVE DIAGNOSES: 1. Right buccal space mass. 2. Posterior scalp cystic mass, approximately 5 cm. 3. Posterior lower cervical midline skin cyst, approximately 2 cm.  POSTOPERATIVE DIAGNOSES: 1. 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 2. Probable scalp sebaceous cyst. 3. Probable cervical sebaceous cyst.  OPERATION: 1. Excision of buccal space mass. 2. Excision of scalp cyst. 3. Excision of posterior lower cervical skin cyst.  SURGEON:  Jefry H. Pollyann Kennedy, M.D.  ANESTHESIA:  General endotracheal anesthesia.  COMPLICATIONS:  None.  ESTIMATED BLOOD LOSS:  20 cc.  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  Large sebaceous-appearing cyst of the scalp approximately 5 cm in diameter.  Deep skin cyst posterior cervical area approximately 2 cm in diameter. All specimens were sent for pathologic evaluation and labeled according to their site of origin.  The patient tolerated the procedure well, was awakened, extubated, and transferred to recovery in stable condition.  REFERRING PHYSICIAN:  Fayne Norrie, M.D.  HISTORY:  A 55 year old with a history of a slowly-enlarging mass in the right buccal area and enlarging cystic masses in the scalp and posterior neck skin. he risks, benefits, alternatives, and complications of the procedure were explained to the patient.  He seemed to understand and agreed to surgery.  DESCRIPTION OF PROCEDURE:  The patient was taken to the operating room and placed on the operating table in the supine position.  Following induction of general endotracheal anesthesia, the patient was draped in the standard fashion for oral-type  surgery.  PROCEDURE #1. EXCISION OF BUCCAL SPACE MASS:  With a mouth gag in place, palpation of the right buccal space was performed and electrocautery was used to create a 3-4 cm mucosal incision in the right buccal space about 2 cm below the parotid duct. Immediately after excising through the mucosa and submucosal tissue, blunt dissection was accomplished to expose the underlying mass, which was then revealed to be what appeared to be a fatty tumor.  Careful dissection and bimanual manipulation was then performed to produce the entire lesion into the oral cavity through the wound.  Electrocautery was used to divide the remaining fibrous and  vascular attachments.  The lesion was removed in three large pieces and was sent for pathologic evaluation.  The defect was irrigated with saline and electrocautery was used to provide hemostasis.  The defect was closed in one layer using 3-0 Vicryl suture, taking care to close the deep tissues as well.  The sutures were  inverted and interrupted and provided a nice closure with good apposition of the deep tissues and minimal dead space.  PROCEDURE #2. The patient was then placed in the left lateral decubitus position using a sandbag.  The posterior scalp and neck were prepped and draped in a standard fashion.  Overlying the lower cervical posterior lesion was a deep dimple in the skin and, on producing pressure to the underlying tissue, a small amount of sebaceous-type material was  expressed.  An ellipse of skin was outlined with a marking pen in a horizontal fashion and electrocautery was used to incise through the skin and down through the subcutaneous tissue.  Careful dissection was accomplished down just around the cystic mass.  The entire lesion was dissected  free of surrounding tissue without actually entering into the cyst.  Bleeding was controlled with electrocautery as needed.  The lesion was removed in its entirety with  the overlying skin intact.  The defect was irrigated with saline and closed with 3-0 Prolene suture in a single layer, with attempts made to close the dead  space in the deep tissues by using vertical mattress-type suture.  Bacitracin and a sterile dressing were applied.  PROCEDURE #3. EXCISION OF SCALP CYSTIC LESION:  Electrocautery was used to incise an ellipse of skin in the anterior to posterior position overlying the center of the lesion.  There was no visible attachment to the skin or the dermis. Careful dissection was accomplished to dissect out the entire cyst, keeping the cyst wall intact.  The contents of the cyst were not violated during the procedure. Several bleeders were encountered along the dissection and were treated with electrocautery.  The lesion was removed in its entirety with attached overlying  skin and sent for pathologic evaluation.  At either end of the incision, which as approximately 6 mg in length, some excess skin was trimmed to help prevent dog-ear deformity.  The defect was irrigated and cautery was used for hemostasis.  The defect was closed in a single layer using 3-0 Prolene suture.  A pressure dressing was applied after applying bacitracin ointment.  A stockinette was then applied on top of the gauze.  The patient was awakened, extubated, and transferred to recovery in stable condition. DD:  01/01/00 TD:  01/01/00 Job: [id number]

## 2011-06-27 ENCOUNTER — Encounter: Payer: Self-pay | Admitting: Family Medicine

## 2011-08-24 ENCOUNTER — Telehealth: Payer: Self-pay | Admitting: Family Medicine

## 2011-08-24 MED ORDER — ZOLPIDEM TARTRATE 10 MG PO TABS: 10.0000 mg | ORAL_TABLET | Freq: Every evening | ORAL | Status: DC | PRN

## 2011-08-24 NOTE — Telephone Encounter (Signed)
Called med in #30 with 2 refills per [place]

## 2011-08-24 NOTE — Telephone Encounter (Signed)
Renew his medication and give him 2 refills

## 2011-09-03 ENCOUNTER — Other Ambulatory Visit: Payer: Self-pay | Admitting: Family Medicine

## 2011-09-03 MED ORDER — ZOLPIDEM TARTRATE 10 MG PO TABS: 10.0000 mg | ORAL_TABLET | Freq: Every evening | ORAL | Status: DC | PRN

## 2011-09-03 NOTE — Telephone Encounter (Signed)
Phoned in Zolpidem tartrate 10 mg   #90   0  Refills to [facility] [phone]

## 2011-09-03 NOTE — Telephone Encounter (Signed)
Call this in and give him a refill

## 2011-12-04 ENCOUNTER — Other Ambulatory Visit: Payer: Self-pay | Admitting: Family Medicine

## 2012-01-09 ENCOUNTER — Ambulatory Visit (INDEPENDENT_AMBULATORY_CARE_PROVIDER_SITE_OTHER): Payer: BC Managed Care – PPO | Admitting: Medical

## 2012-01-09 ENCOUNTER — Other Ambulatory Visit: Payer: Self-pay

## 2012-01-09 ENCOUNTER — Encounter: Payer: Self-pay | Admitting: Medical

## 2012-01-09 VITALS — BP 130/80 | HR 72 | Temp 98.1°F | Resp 16 | Wt 188.0 lb

## 2012-01-09 DIAGNOSIS — Z21 Asymptomatic human immunodeficiency virus [HIV] infection status: Secondary | ICD-10-CM

## 2012-01-09 DIAGNOSIS — G4700 Insomnia, unspecified: Secondary | ICD-10-CM

## 2012-01-09 DIAGNOSIS — I10 Essential (primary) hypertension: Secondary | ICD-10-CM

## 2012-01-09 DIAGNOSIS — F5104 Psychophysiologic insomnia: Secondary | ICD-10-CM

## 2012-01-09 DIAGNOSIS — N529 Male erectile dysfunction, unspecified: Secondary | ICD-10-CM

## 2012-01-09 MED ORDER — ZOLPIDEM TARTRATE 10 MG PO TABS: 10.0000 mg | ORAL_TABLET | Freq: Every evening | ORAL | Status: DC | PRN

## 2012-01-09 MED ORDER — VARDENAFIL HCL 20 MG PO TABS: 20.0000 mg | ORAL_TABLET | Freq: Every day | ORAL | Status: DC | PRN

## 2012-01-09 MED ORDER — LISINOPRIL-HYDROCHLOROTHIAZIDE 10-12.5 MG PO TABS: 1.0000 | ORAL_TABLET | Freq: Every day | ORAL | Status: DC

## 2012-01-09 NOTE — Progress Notes (Signed)
  Subjective:    Patient ID: Dale Watkins, male    DOB: [birth year], 56 y.o.   MRN: [id number]  HPI He is here for medication recheck. He continues on his blood pressure medication and is having no difficulty with this. He continues to use Ambien and does have a history of chronic insomnia. Recently he has also noted difficulty with erectile dysfunction, having trouble getting and maintaining an erection. He continues on his HIV medications and was recently switched to a brand-new chronic. He appears having no difficulty with this. He is being followed at [facility] for this. Recently his Septra was discontinued due to the consistent elevated CD4 count above 200.  Review of Systems     Objective:   Physical Exam Alert and in no distress otherwise not examined       Assessment & Plan:   1. Hypertension   2. Chronic insomnia   3. ED (erectile dysfunction)   4. HIV positive    the Ambien and lisinopril were called in. I gave him a sample of Levitra with instructions on proper use. He is to admit how this works.

## 2012-01-09 NOTE — Patient Instructions (Signed)
Me know if the Levitra works.

## 2012-01-09 NOTE — Telephone Encounter (Signed)
Called med in per jcl 

## 2012-01-18 ENCOUNTER — Telehealth: Payer: Self-pay | Admitting: Family Medicine

## 2012-01-18 ENCOUNTER — Other Ambulatory Visit: Payer: Self-pay | Admitting: Medical

## 2012-01-18 MED ORDER — VARDENAFIL HCL 20 MG PO TABS: 20.0000 mg | ORAL_TABLET | Freq: Every day | ORAL | Status: DC | PRN

## 2012-01-21 ENCOUNTER — Telehealth: Payer: Self-pay | Admitting: Internal Medicine

## 2012-01-21 MED ORDER — VARDENAFIL HCL 20 MG PO TABS: 20.0000 mg | ORAL_TABLET | Freq: Every day | ORAL | Status: DC | PRN

## 2012-01-21 NOTE — Telephone Encounter (Signed)
Okay to refill per shane tysinger. It was accidentally done as a sample instead of sendin to pharmacy

## 2012-01-29 NOTE — Telephone Encounter (Signed)
01/29/2012 

## 2012-05-09 ENCOUNTER — Telehealth: Payer: Self-pay | Admitting: Family Medicine

## 2012-05-09 NOTE — Telephone Encounter (Signed)
Patient came in this afternoon to get his TB skin test read. It was placed on 6/215/13 [facility] [place], [place]. He was told he could come by here to get his TB skin test read. I read it along with Kristian Covey PA-C 0 MM and negative. cls

## 2012-05-09 NOTE — Telephone Encounter (Signed)
I examined his left forearm today for PPD reading.  There is a small 6mm diameter area of ecchymosis but no erythema or induration.  This is more suggestive of bruising rather than a positive PPD test.  He notes that the PPD placement was a little rough.

## 2012-08-04 ENCOUNTER — Telehealth: Payer: Self-pay | Admitting: Family Medicine

## 2012-08-04 ENCOUNTER — Other Ambulatory Visit: Payer: Self-pay | Admitting: Family Medicine

## 2012-08-04 MED ORDER — ZOLPIDEM TARTRATE 10 MG PO TABS: 10.0000 mg | ORAL_TABLET | Freq: Every evening | ORAL | Status: DC | PRN

## 2012-08-04 NOTE — Telephone Encounter (Signed)
Ok to refill 

## 2012-08-04 NOTE — Telephone Encounter (Signed)
Phoned to [facility] [phone] refilled Zolpidem Tartrate 10 mg 1 prn sleep  #90.  0 refills.

## 2012-08-04 NOTE — Progress Notes (Signed)
ok 

## 2012-08-04 NOTE — Telephone Encounter (Signed)
HARRIS TEETER refill req for Zolpidem Tartrate 10 mg 1 prn sleep

## 2012-08-12 ENCOUNTER — Telehealth: Payer: Self-pay | Admitting: Internal Medicine

## 2012-08-12 ENCOUNTER — Ambulatory Visit
Admission: RE | Admit: 2012-08-12 | Discharge: 2012-08-12 | Disposition: A | Discharge status: Home / Self Care | Payer: BC Managed Care – PPO | Source: Ambulatory Visit | Attending: Family Medicine | Admitting: Family Medicine

## 2012-08-12 ENCOUNTER — Ambulatory Visit (INDEPENDENT_AMBULATORY_CARE_PROVIDER_SITE_OTHER): Payer: BC Managed Care – PPO | Admitting: Family Medicine

## 2012-08-12 ENCOUNTER — Encounter: Payer: Self-pay | Admitting: Family Medicine

## 2012-08-12 VITALS — BP 170/100 | HR 100 | Wt 176.0 lb

## 2012-08-12 DIAGNOSIS — F5104 Psychophysiologic insomnia: Secondary | ICD-10-CM

## 2012-08-12 DIAGNOSIS — G4700 Insomnia, unspecified: Secondary | ICD-10-CM

## 2012-08-12 DIAGNOSIS — Z21 Asymptomatic human immunodeficiency virus [HIV] infection status: Secondary | ICD-10-CM

## 2012-08-12 DIAGNOSIS — M549 Dorsalgia, unspecified: Secondary | ICD-10-CM

## 2012-08-12 MED ORDER — ZOLPIDEM TARTRATE 10 MG PO TABS: 10.0000 mg | ORAL_TABLET | Freq: Every evening | ORAL | Status: DC | PRN

## 2012-08-12 NOTE — Telephone Encounter (Signed)
Pt needs his Ambien sent in for 6 month at a time. Call in to [facility] on  [address]

## 2012-08-12 NOTE — Telephone Encounter (Signed)
Call in his Ambien. Also let him know that the x-ray showed minor degenerative changes. Recommend 2 Aleve twice a day, heat to his back for 20 minutes 3 times per day for the next 2 weeks and if continued difficulty recheck here.

## 2012-08-12 NOTE — Telephone Encounter (Signed)
Pt informed word for word pt verbalized understanding also called Ambien in per [facility]

## 2012-08-12 NOTE — Progress Notes (Signed)
  Subjective:    Patient ID: Dale Watkins., male    DOB: September 21, 1956, 55 y.o.   MRN: [id number]  HPI He is here for evaluation of a three-week history of back pain. This is in the mid back and right-sided pain. He notes an increase in pain with change in position. No numbness, tingling or weakness. No referred pain. He has tried Aleve 2-4 tablets per day. He has had recent blood work done at [facility] which was apparently normal. He also would like a refill on his Ambien. He does have a long history of chronic insomnia. Review of Systems     Objective:   Physical Exam Slight splinting noted when he got out of the chair to be evaluated. Good lumbar motion. No tenderness palpation over the spine or SI joints. Negative straight leg raising. DTRs were diminished. X-ray shows degenerative changes.       Assessment & Plan:   1. Back pain  DG Lumbar Spine Complete  2. HIV positive    3. Chronic insomnia     I will renew his Ambien. Also recommend conservative care with heat, stretching and anti-inflammatory. If no improvement, return here in 2 weeks.

## 2012-10-27 ENCOUNTER — Encounter: Payer: Self-pay | Admitting: Family Medicine

## 2012-10-27 ENCOUNTER — Ambulatory Visit (INDEPENDENT_AMBULATORY_CARE_PROVIDER_SITE_OTHER): Payer: BC Managed Care – PPO | Admitting: Family Medicine

## 2012-10-27 VITALS — BP 150/82 | HR 82 | Wt 185.0 lb

## 2012-10-27 DIAGNOSIS — M1990 Unspecified osteoarthritis, unspecified site: Secondary | ICD-10-CM

## 2012-10-27 DIAGNOSIS — M129 Arthropathy, unspecified: Secondary | ICD-10-CM

## 2012-10-27 LAB — CBC WITH DIFFERENTIAL/PLATELET
Basophils Absolute: 0 10*3/uL (ref 0.0–0.1)
Basophils Relative: 1 % (ref 0–1)
Eosinophils Absolute: 0.2 10*3/uL (ref 0.0–0.7)
Eosinophils Relative: 4 % (ref 0–5)
HCT: 46 % (ref 39.0–52.0)
Hemoglobin: 16.2 g/dL (ref 13.0–17.0)
Lymphocytes Relative: 35 % (ref 12–46)
Lymphs Abs: 1.5 10*3/uL (ref 0.7–4.0)
MCH: 29.3 pg (ref 26.0–34.0)
MCHC: 35.2 g/dL (ref 30.0–36.0)
MCV: 83.2 fL (ref 78.0–100.0)
Monocytes Absolute: 0.3 10*3/uL (ref 0.1–1.0)
Monocytes Relative: 8 % (ref 3–12)
Neutro Abs: 2.4 10*3/uL (ref 1.7–7.7)
Neutrophils Relative %: 52 % (ref 43–77)
Platelets: 215 10*3/uL (ref 150–400)
RBC: 5.53 MIL/uL (ref 4.22–5.81)
RDW: 14.7 % (ref 11.5–15.5)
WBC: 4.5 10*3/uL (ref 4.0–10.5)

## 2012-10-27 LAB — COMPREHENSIVE METABOLIC PANEL
ALT: 14 U/L (ref 0–53)
AST: 16 U/L (ref 0–37)
Albumin: 4.9 g/dL (ref 3.5–5.2)
Alkaline Phosphatase: 70 U/L (ref 39–117)
BUN: 17 mg/dL (ref 6–23)
CO2: 26 mEq/L (ref 19–32)
Calcium: 9.2 mg/dL (ref 8.4–10.5)
Chloride: 104 mEq/L (ref 96–112)
Creat: 0.78 mg/dL (ref 0.50–1.35)
Glucose, Bld: 94 mg/dL (ref 70–99)
Potassium: 3.9 mEq/L (ref 3.5–5.3)
Sodium: 143 mEq/L (ref 135–145)
Total Bilirubin: 0.5 mg/dL (ref 0.3–1.2)
Total Protein: 6.8 g/dL (ref 6.0–8.3)

## 2012-10-27 LAB — RHEUMATOID FACTOR: Rhuematoid fact SerPl-aCnc: 30 IU/mL — ABNORMAL HIGH (ref <=14)

## 2012-10-27 NOTE — Patient Instructions (Signed)
Take 800 mg of ibuprofen 3 times per day. 

## 2012-10-27 NOTE — Progress Notes (Signed)
  Subjective:    Patient ID: Dale Salk., male    DOB: 10-14-1956, 56 y.o.   MRN: [id number]  HPI He is here for recheck. He was seen in October for evaluation of back pain. He now states that he is having neck and back pain as well as difficulty with shoulders, elbows, hips and knees. He has not noted any swelling or redness. He has tried Aleve 2 twice a day as well as Tylenol. Apparently Aleve did help initially. He continues on medications listed in the chart.   Review of Systems     Objective:   Physical Exam Alert and in no distress. Pain on motion of his back with some tenderness over the paraspinal muscles in the lumbar area. Exam of the wrists, fingers, elbows, knees and ankles shows no swelling, deformity, redness.       Assessment & Plan:   1. Arthritis  CBC with Differential, Comprehensive metabolic panel, Sedimentation Rate, Rheumatoid factor, ANA   discussed the possibility of referring him to rheumatology pending lab results. Also recommend ibuprofen 800 mg 3 times a day.

## 2012-10-28 LAB — ANA: Anti Nuclear Antibody(ANA): NEGATIVE

## 2012-10-28 LAB — SEDIMENTATION RATE: Sed Rate: 4 mm/hr (ref 0–16)

## 2012-10-28 NOTE — Progress Notes (Signed)
Quick Note:  CALLED PT HOME AND CELL # THE SAME LEFT MESSAGE THAT ONE OF HIS TEST CAME BACK SLIGHTLY POSITIVE AND HAVE REFERRED HIM TO DR.TRUSLOW FOR FURTHER EVAL ______

## 2012-10-28 NOTE — Progress Notes (Signed)
Quick Note:  Let him know that one of the tests was slightly positive. Refer him to rheumatology for further evaluation of his arthralgias ______

## 2012-12-11 ENCOUNTER — Ambulatory Visit: Payer: BC Managed Care – PPO | Admitting: Family Medicine

## 2013-01-07 ENCOUNTER — Telehealth: Payer: Self-pay | Admitting: Internal Medicine

## 2013-01-07 MED ORDER — ZOLPIDEM TARTRATE 10 MG PO TABS: 10.0000 mg | ORAL_TABLET | Freq: Every evening | ORAL | Status: DC | PRN

## 2013-01-07 NOTE — Telephone Encounter (Signed)
Renew the medicine with 5 refills

## 2013-01-07 NOTE — Telephone Encounter (Signed)
Called ambien in per jcl 

## 2013-01-14 ENCOUNTER — Encounter: Payer: Self-pay | Admitting: Family Medicine

## 2013-01-14 ENCOUNTER — Ambulatory Visit (INDEPENDENT_AMBULATORY_CARE_PROVIDER_SITE_OTHER): Payer: BC Managed Care – PPO | Admitting: Family Medicine

## 2013-01-14 DIAGNOSIS — F329 Major depressive disorder, single episode, unspecified: Secondary | ICD-10-CM

## 2013-01-14 MED ORDER — ESCITALOPRAM OXALATE 10 MG PO TABS: 10.0000 mg | ORAL_TABLET | Freq: Every day | ORAL | Status: DC

## 2013-01-14 NOTE — Patient Instructions (Signed)
I would recommend that you see Darryl Hyers [phone]  [phone]

## 2013-01-14 NOTE — Progress Notes (Signed)
  Subjective:    Patient ID: Dale Salk., male    DOB: Mar 12, 1956, 57 y.o.   MRN: [id number]  HPI He is here for consult concerning stress that he is under. He cites relationship issues as well as work stress. He is considering moving back to this area from [place]. He is involved in a relationship but does see some red flags in a relationship that has him quite concerned.He is also dealing with both parents who are getting older in age and his mother now apparently was diagnosed with dementia. This is weighing heavily on him causing him to become more irritable with mood swings. He continues had difficulty with sleep. His weight is stable. In the past he did respond to antidepressant medications and is interested in getting back on this. He continues on other medications listed in the chart.   Review of Systems     Objective:   Physical Exam alert and in no distress with appropriate affect       Assessment & Plan:  Depressive disorder, not elsewhere classified - Plan: escitalopram (LEXAPRO) 10 MG tablet I discussed distress and he is under concerning all these issues. I discussed the possibility of him getting involved in counseling to help deal with all of these. Related the fact that these are very real issues in that medication will not cure it but certainly help. I will place him back on Lexapro and reevaluate him in one month.

## 2013-02-10 ENCOUNTER — Encounter: Payer: Self-pay | Admitting: Internal Medicine

## 2013-02-16 ENCOUNTER — Ambulatory Visit (INDEPENDENT_AMBULATORY_CARE_PROVIDER_SITE_OTHER): Payer: BC Managed Care – PPO | Admitting: Family Medicine

## 2013-02-16 ENCOUNTER — Encounter: Payer: Self-pay | Admitting: Family Medicine

## 2013-02-16 VITALS — BP 140/90 | HR 64 | Wt 187.0 lb

## 2013-02-16 DIAGNOSIS — F329 Major depressive disorder, single episode, unspecified: Secondary | ICD-10-CM

## 2013-02-16 DIAGNOSIS — F3289 Other specified depressive episodes: Secondary | ICD-10-CM

## 2013-02-16 NOTE — Patient Instructions (Signed)
Keep taking good care of yourself 

## 2013-02-16 NOTE — Progress Notes (Signed)
  Subjective:    Patient ID: Dale Salk., male    DOB: Nov 17, 1955, 57 y.o.   MRN: [id number]  HPI He is here for recheck. He now states he is back to normal. He has had 2 good meetings, one with his partner and the other with his father. He has resolved issues with both of them and feels very good about it. His work is also going well.   Review of Systems     Objective:   Physical Exam Alert and in no distress with a very positive demeanor.       Assessment & Plan:  Depressive disorder, not elsewhere classified he has done a lot of homework and I commended him for this. I will keep him on this medication for the time being.

## 2013-06-09 ENCOUNTER — Telehealth: Payer: Self-pay | Admitting: Family Medicine

## 2013-06-09 DIAGNOSIS — F329 Major depressive disorder, single episode, unspecified: Secondary | ICD-10-CM

## 2013-06-09 MED ORDER — ESCITALOPRAM OXALATE 10 MG PO TABS: 10.0000 mg | ORAL_TABLET | Freq: Every day | ORAL | Status: DC

## 2013-06-09 NOTE — Telephone Encounter (Signed)
Lexapro called in.

## 2013-07-20 ENCOUNTER — Telehealth: Payer: Self-pay | Admitting: Internal Medicine

## 2013-07-20 MED ORDER — ZOLPIDEM TARTRATE 10 MG PO TABS: 10.0000 mg | ORAL_TABLET | Freq: Every evening | ORAL | Status: DC | PRN

## 2013-07-20 NOTE — Telephone Encounter (Signed)
OK to  Renew

## 2013-07-20 NOTE — Telephone Encounter (Signed)
CALLED IN AMBIEN PER JCL 

## 2013-07-20 NOTE — Telephone Encounter (Signed)
Refill request for zolpidem tartrate 10mg  to [facility]

## 2013-08-17 ENCOUNTER — Ambulatory Visit
Admission: RE | Admit: 2013-08-17 | Discharge: 2013-08-17 | Disposition: A | Discharge status: Home / Self Care | Payer: BC Managed Care – PPO | Source: Ambulatory Visit | Attending: Family Medicine | Admitting: Family Medicine

## 2013-08-17 ENCOUNTER — Other Ambulatory Visit: Payer: Self-pay | Admitting: Rheumatology

## 2013-08-17 ENCOUNTER — Ambulatory Visit (INDEPENDENT_AMBULATORY_CARE_PROVIDER_SITE_OTHER): Payer: BC Managed Care – PPO | Admitting: Family Medicine

## 2013-08-17 ENCOUNTER — Encounter: Payer: Self-pay | Admitting: Family Medicine

## 2013-08-17 VITALS — BP 150/90 | HR 77 | Wt 191.0 lb

## 2013-08-17 DIAGNOSIS — F5104 Psychophysiologic insomnia: Secondary | ICD-10-CM

## 2013-08-17 DIAGNOSIS — M25559 Pain in unspecified hip: Secondary | ICD-10-CM

## 2013-08-17 DIAGNOSIS — M545 Low back pain, unspecified: Secondary | ICD-10-CM

## 2013-08-17 DIAGNOSIS — I10 Essential (primary) hypertension: Secondary | ICD-10-CM

## 2013-08-17 DIAGNOSIS — G4700 Insomnia, unspecified: Secondary | ICD-10-CM

## 2013-08-17 DIAGNOSIS — M25551 Pain in right hip: Secondary | ICD-10-CM

## 2013-08-17 DIAGNOSIS — Z21 Asymptomatic human immunodeficiency virus [HIV] infection status: Secondary | ICD-10-CM

## 2013-08-17 MED ORDER — ZOLPIDEM TARTRATE 10 MG PO TABS: 10.0000 mg | ORAL_TABLET | Freq: Every evening | ORAL | Status: DC | PRN

## 2013-08-17 MED ORDER — LISINOPRIL-HYDROCHLOROTHIAZIDE 10-12.5 MG PO TABS: 1.0000 | ORAL_TABLET | Freq: Every day | ORAL | Status: DC

## 2013-08-17 NOTE — Progress Notes (Signed)
Quick Note:  The x-ray does show hip arthritis and degenerative changes in his lumbar spine. This was discussed with Dr. Kellie Simmering. He does have an MRI ordered on his back. We will await the results of that before going further. ______

## 2013-08-17 NOTE — Progress Notes (Signed)
  Subjective:    Patient ID: Dale Salk., male    DOB: May 18, 1956, 56 y.o.   MRN: [id number]  HPI He is here for recheck. He did stop taking his blood pressure medication about a month ago stating he just decided to stop it. He continues to have low back and right hip pain. He is being seen by Dr. Kellie Simmering. He ordered an MRI however insurance is pending. He continues to have difficulty with chronic insomnia as well as doing from his hip and back pain. He continues to followed at [facility] for his underlying HIV disease.   Review of Systems     Objective:   Physical Exam Alert and in no distress. Blood pressure is recorded. Motion of the right hip especially with internal and external rotation was painful. DTRs are normal. Straight leg raising was equivocal.       Assessment & Plan:  Right hip pain - Plan: DG Hip Complete Right  Low back pain - Plan: DG Lumbar Spine Complete  HIV positive  HYPERTENSION  Chronic insomnia  I will place him back on his lisinopril. Also order x-rays for further evaluation of his hip and back. Recheck here one month

## 2013-08-20 ENCOUNTER — Ambulatory Visit
Admission: RE | Admit: 2013-08-20 | Discharge: 2013-08-20 | Disposition: A | Discharge status: Home / Self Care | Payer: BC Managed Care – PPO | Source: Ambulatory Visit | Attending: Rheumatology | Admitting: Rheumatology

## 2013-08-20 DIAGNOSIS — M545 Low back pain, unspecified: Secondary | ICD-10-CM

## 2013-08-24 ENCOUNTER — Other Ambulatory Visit: Payer: Self-pay | Admitting: Rheumatology

## 2013-08-24 ENCOUNTER — Telehealth: Payer: Self-pay | Admitting: Family Medicine

## 2013-08-24 DIAGNOSIS — M48061 Spinal stenosis, lumbar region without neurogenic claudication: Secondary | ICD-10-CM

## 2013-08-24 NOTE — Telephone Encounter (Signed)
Pt called and stated that he heard from another doc today about his MRI. He doesn't understand what he said so he is requesting that you call him and explain it to him so he can understand. Please call pt.

## 2013-08-26 ENCOUNTER — Ambulatory Visit
Admission: RE | Admit: 2013-08-26 | Discharge: 2013-08-26 | Disposition: A | Discharge status: Home / Self Care | Payer: BC Managed Care – PPO | Source: Ambulatory Visit | Attending: Rheumatology | Admitting: Rheumatology

## 2013-08-26 VITALS — BP 145/70 | HR 58

## 2013-08-26 DIAGNOSIS — M48061 Spinal stenosis, lumbar region without neurogenic claudication: Secondary | ICD-10-CM

## 2013-08-26 MED ORDER — METHYLPREDNISOLONE ACETATE 40 MG/ML INJ SUSP (RADIOLOG
120.0000 mg | Freq: Once | INTRAMUSCULAR | Status: AC
  Administered 2013-08-26: 120 mg via EPIDURAL

## 2013-08-26 MED ORDER — IOHEXOL 180 MG/ML  SOLN
1.0000 mL | Freq: Once | INTRAMUSCULAR | Status: AC | PRN
  Administered 2013-08-26: 1 mL via EPIDURAL

## 2013-08-26 NOTE — Discharge Instructions (Signed)

## 2013-09-11 ENCOUNTER — Other Ambulatory Visit: Payer: Self-pay | Admitting: Rheumatology

## 2013-09-11 DIAGNOSIS — M5410 Radiculopathy, site unspecified: Secondary | ICD-10-CM

## 2013-09-11 DIAGNOSIS — M549 Dorsalgia, unspecified: Secondary | ICD-10-CM

## 2013-09-16 ENCOUNTER — Ambulatory Visit: Payer: BC Managed Care – PPO | Admitting: Family Medicine

## 2013-09-18 ENCOUNTER — Ambulatory Visit
Admission: RE | Admit: 2013-09-18 | Discharge: 2013-09-18 | Disposition: A | Discharge status: Home / Self Care | Payer: BC Managed Care – PPO | Source: Ambulatory Visit | Attending: Rheumatology | Admitting: Rheumatology

## 2013-09-18 ENCOUNTER — Other Ambulatory Visit: Payer: Self-pay | Admitting: Rheumatology

## 2013-09-18 VITALS — BP 126/75 | HR 59

## 2013-09-18 DIAGNOSIS — M5410 Radiculopathy, site unspecified: Secondary | ICD-10-CM

## 2013-09-18 DIAGNOSIS — M549 Dorsalgia, unspecified: Secondary | ICD-10-CM

## 2013-09-18 MED ORDER — IOHEXOL 180 MG/ML  SOLN
1.0000 mL | Freq: Once | INTRAMUSCULAR | Status: AC | PRN
  Administered 2013-09-18: 1 mL via INTRA_ARTICULAR

## 2013-09-18 MED ORDER — IOHEXOL 180 MG/ML  SOLN: 1.0000 mL | Freq: Once | INTRAMUSCULAR | Status: AC | PRN

## 2013-09-18 MED ORDER — METHYLPREDNISOLONE ACETATE 40 MG/ML INJ SUSP (RADIOLOG: 120.0000 mg | Freq: Once | INTRAMUSCULAR | Status: DC

## 2013-09-18 MED ORDER — METHYLPREDNISOLONE ACETATE 40 MG/ML INJ SUSP (RADIOLOG
120.0000 mg | Freq: Once | INTRAMUSCULAR | Status: AC
  Administered 2013-09-18: 120 mg via INTRA_ARTICULAR

## 2013-09-22 ENCOUNTER — Encounter: Payer: Self-pay | Admitting: Family Medicine

## 2013-09-22 ENCOUNTER — Ambulatory Visit (INDEPENDENT_AMBULATORY_CARE_PROVIDER_SITE_OTHER): Payer: BC Managed Care – PPO | Admitting: Family Medicine

## 2013-09-22 ENCOUNTER — Ambulatory Visit: Payer: BC Managed Care – PPO | Admitting: Family Medicine

## 2013-09-22 VITALS — BP 160/90 | Wt 191.0 lb

## 2013-09-22 DIAGNOSIS — I10 Essential (primary) hypertension: Secondary | ICD-10-CM

## 2013-09-22 NOTE — Progress Notes (Signed)
  Subjective:    Patient ID: Dale Salk., male    DOB: [birth year], 57 y.o.   MRN: [id number]  HPI He is here for a blood pressure recheck. He has had an eventful last month or 2. He has had 2 epidural injections. He still having pain. He states that he has checking his blood pressure at a pharmacy and it has been running in the normal range.  Review of Systems     Objective:   Physical Exam Alert and in no distress. Pressure is recorded.       Assessment & Plan:  Hypertension  he will monitor his blood pressure and return here in one month. I do not want to change meds based on one reading especially after the back pain and epidural injections.

## 2013-09-23 ENCOUNTER — Other Ambulatory Visit: Payer: Self-pay | Admitting: Rheumatology

## 2013-09-23 DIAGNOSIS — M5410 Radiculopathy, site unspecified: Secondary | ICD-10-CM

## 2013-09-23 DIAGNOSIS — M549 Dorsalgia, unspecified: Secondary | ICD-10-CM

## 2013-10-20 ENCOUNTER — Ambulatory Visit
Admission: RE | Admit: 2013-10-20 | Discharge: 2013-10-20 | Disposition: A | Discharge status: Home / Self Care | Payer: BC Managed Care – PPO | Source: Ambulatory Visit | Attending: Rheumatology | Admitting: Rheumatology

## 2013-10-20 DIAGNOSIS — M549 Dorsalgia, unspecified: Secondary | ICD-10-CM

## 2013-10-20 DIAGNOSIS — M5410 Radiculopathy, site unspecified: Secondary | ICD-10-CM

## 2013-10-20 MED ORDER — IOHEXOL 180 MG/ML  SOLN
1.0000 mL | Freq: Once | INTRAMUSCULAR | Status: AC | PRN
  Administered 2013-10-20: 1 mL via EPIDURAL

## 2013-10-20 MED ORDER — METHYLPREDNISOLONE ACETATE 40 MG/ML INJ SUSP (RADIOLOG
120.0000 mg | Freq: Once | INTRAMUSCULAR | Status: AC
  Administered 2013-10-20: 120 mg via EPIDURAL

## 2013-10-21 ENCOUNTER — Other Ambulatory Visit: Payer: Self-pay | Admitting: Family Medicine

## 2013-10-21 NOTE — Telephone Encounter (Signed)
Is this okay to phone in? 

## 2013-10-22 ENCOUNTER — Other Ambulatory Visit: Payer: Self-pay

## 2013-10-22 ENCOUNTER — Ambulatory Visit (INDEPENDENT_AMBULATORY_CARE_PROVIDER_SITE_OTHER): Payer: BC Managed Care – PPO | Admitting: Family Medicine

## 2013-10-22 ENCOUNTER — Other Ambulatory Visit: Payer: Self-pay | Admitting: *Deleted

## 2013-10-22 ENCOUNTER — Encounter: Payer: Self-pay | Admitting: Family Medicine

## 2013-10-22 VITALS — BP 150/100 | HR 73 | Wt 181.0 lb

## 2013-10-22 DIAGNOSIS — I10 Essential (primary) hypertension: Secondary | ICD-10-CM

## 2013-10-22 MED ORDER — LISINOPRIL-HYDROCHLOROTHIAZIDE 20-12.5 MG PO TABS: 1.0000 | ORAL_TABLET | Freq: Every day | ORAL | Status: DC

## 2013-10-22 MED ORDER — ZOLPIDEM TARTRATE 10 MG PO TABS: ORAL_TABLET | ORAL | Status: DC

## 2013-10-22 NOTE — Progress Notes (Signed)
   Subjective:    Patient ID: Dale Salk., male    DOB: 10-Feb-1956, 57 y.o.   MRN: [id number]  HPI He is here for a blood pressure recheck. He did bring his readings in. In general they look pretty good. His had only a few of them abnormal. Recently had epidural injections. He has lost approximately 10 pounds by his account.   Review of Systems     Objective:   Physical Exam Alert and in no distress. Blood pressure is recorded.       Assessment & Plan:  HYPERTENSION - Plan: lisinopril-hydrochlorothiazide (ZESTORETIC) 20-12.5 MG per tablet  I will increase his lisinopril. Encouraged him to increase his physical activity. Also encouraged him to bring his blood pressure machine in with him to measure against ours for accuracy. Recheck here one month

## 2013-10-22 NOTE — Telephone Encounter (Signed)
CALLED AMBIEN

## 2013-11-26 ENCOUNTER — Ambulatory Visit (INDEPENDENT_AMBULATORY_CARE_PROVIDER_SITE_OTHER): Payer: BC Managed Care – PPO | Admitting: Family Medicine

## 2013-11-26 ENCOUNTER — Encounter: Payer: Self-pay | Admitting: Family Medicine

## 2013-11-26 VITALS — BP 160/112 | HR 74 | Wt 187.0 lb

## 2013-11-26 DIAGNOSIS — Z79899 Other long term (current) drug therapy: Secondary | ICD-10-CM

## 2013-11-26 DIAGNOSIS — I10 Essential (primary) hypertension: Secondary | ICD-10-CM

## 2013-11-26 MED ORDER — AMLODIPINE BESYLATE 5 MG PO TABS: 5.0000 mg | ORAL_TABLET | Freq: Every day | ORAL | Status: DC

## 2013-11-26 NOTE — Progress Notes (Signed)
   Subjective:    Patient ID: Dale Watkins., male    DOB: 30-Nov-1955, 58 y.o.   MRN: [id number]  HPI He is here for recheck on his hypertension. He continues on medications listed in the chart and is having no difficulty with them. He is followed at [facility] for his underlying HIV. He has been checking his blood pressures at home and apparently they're in a good range.  Review of Systems     Objective:   Physical Exam Alert and in no distress. Blood pressure was rechecked by me 160/90       Assessment & Plan:  Hypertension - Plan: amLODipine (NORVASC) 5 MG tablet  Encounter for long-term (current) use of other medications - Plan: amLODipine (NORVASC) 5 MG tablet  I will add Norvasc to his regimen. Discussed possible side effects with him. Recommend he bring his blood pressure cuff and then measured against ours.

## 2013-12-22 ENCOUNTER — Other Ambulatory Visit: Payer: Self-pay | Admitting: Rheumatology

## 2013-12-22 DIAGNOSIS — M5410 Radiculopathy, site unspecified: Secondary | ICD-10-CM

## 2013-12-29 ENCOUNTER — Ambulatory Visit: Payer: BC Managed Care – PPO | Admitting: Family Medicine

## 2013-12-31 ENCOUNTER — Encounter: Payer: Self-pay | Admitting: Family Medicine

## 2013-12-31 ENCOUNTER — Ambulatory Visit (INDEPENDENT_AMBULATORY_CARE_PROVIDER_SITE_OTHER): Payer: BC Managed Care – PPO | Admitting: Family Medicine

## 2013-12-31 VITALS — BP 140/80 | HR 68 | Wt 188.0 lb

## 2013-12-31 DIAGNOSIS — I10 Essential (primary) hypertension: Secondary | ICD-10-CM

## 2013-12-31 MED ORDER — TERAZOSIN HCL 1 MG PO CAPS: 1.0000 mg | ORAL_CAPSULE | Freq: Every day | ORAL | Status: DC

## 2013-12-31 NOTE — Progress Notes (Signed)
   Subjective:    Patient ID: Ames Coupe., male    DOB: 1956-08-07, 58 y.o.   MRN: [id number]  HPI He is here for recheck. He states that since being placed on amlodipine, he has noted increase in swelling and bloating and generally not feeling good. He would like to stop this medication.   Review of Systems     Objective:   Physical Exam Alert and in no distress. Face does appear slightly edematous. Blood pressure is recorded       Assessment & Plan:  Hypertension - Plan: terazosin (HYTRIN) 1 MG capsule  I will stop his amlodipine and switch him to Hytrin. Discussed the use of this medication and other potential benefits with bladder related issues. Also discussed taking the first dose at night. Recheck here one month.

## 2014-01-15 ENCOUNTER — Telehealth: Payer: Self-pay | Admitting: Internal Medicine

## 2014-01-15 NOTE — Telephone Encounter (Signed)
Refill request for ambien 10mg  to [facility] to [address]

## 2014-01-16 NOTE — Telephone Encounter (Signed)
Okay to read and fill with as needed refills

## 2014-01-18 NOTE — Telephone Encounter (Signed)
Medication called in 

## 2014-01-19 ENCOUNTER — Telehealth: Payer: Self-pay | Admitting: Internal Medicine

## 2014-01-19 MED ORDER — ZOLPIDEM TARTRATE 10 MG PO TABS: ORAL_TABLET | ORAL | Status: DC

## 2014-01-19 NOTE — Telephone Encounter (Signed)
Pharmacy called and pharmacy states that we only called in 10 pills and he gets 90 pills. Per jcl ok to send in med per previous note. tammy has called in #90 with 1 refill to [facility]

## 2014-01-26 ENCOUNTER — Other Ambulatory Visit: Payer: Self-pay | Admitting: Rheumatology

## 2014-01-26 DIAGNOSIS — M5410 Radiculopathy, site unspecified: Secondary | ICD-10-CM

## 2014-01-26 DIAGNOSIS — M4800 Spinal stenosis, site unspecified: Secondary | ICD-10-CM

## 2014-01-28 ENCOUNTER — Encounter: Payer: Self-pay | Admitting: Family Medicine

## 2014-01-28 ENCOUNTER — Ambulatory Visit (INDEPENDENT_AMBULATORY_CARE_PROVIDER_SITE_OTHER): Payer: BC Managed Care – PPO | Admitting: Family Medicine

## 2014-01-28 VITALS — BP 120/62 | HR 68 | Wt 190.0 lb

## 2014-01-28 DIAGNOSIS — I10 Essential (primary) hypertension: Secondary | ICD-10-CM

## 2014-01-28 MED ORDER — TERAZOSIN HCL 1 MG PO CAPS: 1.0000 mg | ORAL_CAPSULE | Freq: Every day | ORAL | Status: DC

## 2014-01-28 NOTE — Progress Notes (Signed)
   Subjective:    Patient ID: Dale Watkins., male    DOB: 04/29/1956, 58 y.o.   MRN: [id number]  HPI He is here for recheck. He was switched to Hytrin with his last visit due to unacceptable side effects from amlodipine. He is having no difficulty with his new medication.   Review of Systems     Objective:   Physical Exam Alert and in no distress. Blood pressure is recorded.       Assessment & Plan:  Hypertension  continue on present medication regimen.

## 2014-02-25 ENCOUNTER — Ambulatory Visit
Admission: RE | Admit: 2014-02-25 | Discharge: 2014-02-25 | Disposition: A | Discharge status: Home / Self Care | Payer: BC Managed Care – PPO | Source: Ambulatory Visit | Attending: Rheumatology | Admitting: Rheumatology

## 2014-02-25 DIAGNOSIS — M5410 Radiculopathy, site unspecified: Secondary | ICD-10-CM

## 2014-02-25 DIAGNOSIS — M4800 Spinal stenosis, site unspecified: Secondary | ICD-10-CM

## 2014-02-25 MED ORDER — IOHEXOL 180 MG/ML  SOLN
1.0000 mL | Freq: Once | INTRAMUSCULAR | Status: AC | PRN
  Administered 2014-02-25: 1 mL via EPIDURAL

## 2014-02-25 MED ORDER — METHYLPREDNISOLONE ACETATE 40 MG/ML INJ SUSP (RADIOLOG
120.0000 mg | Freq: Once | INTRAMUSCULAR | Status: AC
  Administered 2014-02-25: 120 mg via EPIDURAL

## 2014-04-21 ENCOUNTER — Other Ambulatory Visit: Payer: Self-pay | Admitting: Internal Medicine

## 2014-04-21 ENCOUNTER — Other Ambulatory Visit: Payer: BC Managed Care – PPO

## 2014-04-21 DIAGNOSIS — B20 Human immunodeficiency virus [HIV] disease: Secondary | ICD-10-CM

## 2014-04-22 ENCOUNTER — Other Ambulatory Visit: Payer: BC Managed Care – PPO

## 2014-04-22 LAB — T-HELPER CELLS (CD4) COUNT (NOT AT ARMC)
Absolute CD4: 254 /uL — ABNORMAL LOW (ref 381–1469)
CD4 T Helper %: 18 % — ABNORMAL LOW (ref 32–62)
Total Lymphocyte: 30 % (ref 12–46)
Total lymphocyte count: 1410 /uL (ref 700–3300)
WBC, lymph enumeration: 4.7 10*3/uL (ref 4.0–10.5)

## 2014-04-22 LAB — COMPREHENSIVE METABOLIC PANEL
ALT: 16 U/L (ref 0–53)
AST: 14 U/L (ref 0–37)
Albumin: 4.8 g/dL (ref 3.5–5.2)
Alkaline Phosphatase: 51 U/L (ref 39–117)
BUN: 27 mg/dL — ABNORMAL HIGH (ref 6–23)
CO2: 24 mEq/L (ref 19–32)
Calcium: 9.4 mg/dL (ref 8.4–10.5)
Chloride: 103 mEq/L (ref 96–112)
Creat: 0.76 mg/dL (ref 0.50–1.35)
Glucose, Bld: 97 mg/dL (ref 70–99)
Potassium: 4.2 mEq/L (ref 3.5–5.3)
Sodium: 138 mEq/L (ref 135–145)
Total Bilirubin: 0.4 mg/dL (ref 0.2–1.2)
Total Protein: 6.9 g/dL (ref 6.0–8.3)

## 2014-04-22 LAB — CBC WITH DIFFERENTIAL/PLATELET
Basophils Absolute: 0 10*3/uL (ref 0.0–0.1)
Basophils Relative: 0 % (ref 0–1)
Eosinophils Absolute: 0.2 10*3/uL (ref 0.0–0.7)
Eosinophils Relative: 4 % (ref 0–5)
HCT: 44.3 % (ref 39.0–52.0)
Hemoglobin: 15.6 g/dL (ref 13.0–17.0)
Lymphocytes Relative: 30 % (ref 12–46)
Lymphs Abs: 1.4 10*3/uL (ref 0.7–4.0)
MCH: 31 pg (ref 26.0–34.0)
MCHC: 35.2 g/dL (ref 30.0–36.0)
MCV: 87.9 fL (ref 78.0–100.0)
Monocytes Absolute: 0.4 10*3/uL (ref 0.1–1.0)
Monocytes Relative: 8 % (ref 3–12)
Neutro Abs: 2.7 10*3/uL (ref 1.7–7.7)
Neutrophils Relative %: 58 % (ref 43–77)
Platelets: 199 10*3/uL (ref 150–400)
RBC: 5.04 MIL/uL (ref 4.22–5.81)
RDW: 13.9 % (ref 11.5–15.5)
WBC: 4.7 10*3/uL (ref 4.0–10.5)

## 2014-04-23 LAB — HIV-1 RNA ULTRAQUANT REFLEX TO GENTYP+
HIV 1 RNA Quant: <20 copies/mL (ref <20)
HIV-1 RNA Quant, Log: <1.3 {Log} (ref <1.30)

## 2014-04-28 ENCOUNTER — Telehealth: Payer: Self-pay | Admitting: Family Medicine

## 2014-04-28 NOTE — Telephone Encounter (Signed)
Dr. Marlinda Mike from [facility] called for copy of labs to be faxed [phone]

## 2014-06-02 ENCOUNTER — Other Ambulatory Visit: Payer: Self-pay | Admitting: Rheumatology

## 2014-06-02 DIAGNOSIS — M545 Low back pain, unspecified: Secondary | ICD-10-CM

## 2014-06-02 DIAGNOSIS — G8929 Other chronic pain: Secondary | ICD-10-CM

## 2014-06-16 ENCOUNTER — Ambulatory Visit
Admission: RE | Admit: 2014-06-16 | Discharge: 2014-06-16 | Disposition: A | Discharge status: Home / Self Care | Payer: BC Managed Care – PPO | Source: Ambulatory Visit | Attending: Rheumatology | Admitting: Rheumatology

## 2014-06-16 VITALS — BP 147/70 | HR 69

## 2014-06-16 DIAGNOSIS — M545 Low back pain, unspecified: Secondary | ICD-10-CM

## 2014-06-16 DIAGNOSIS — G8929 Other chronic pain: Secondary | ICD-10-CM

## 2014-06-16 MED ORDER — METHYLPREDNISOLONE ACETATE 40 MG/ML INJ SUSP (RADIOLOG
120.0000 mg | Freq: Once | INTRAMUSCULAR | Status: AC
  Administered 2014-06-16: 120 mg via EPIDURAL

## 2014-06-16 MED ORDER — IOHEXOL 180 MG/ML  SOLN
1.0000 mL | Freq: Once | INTRAMUSCULAR | Status: AC | PRN
  Administered 2014-06-16: 1 mL via EPIDURAL

## 2014-07-07 ENCOUNTER — Telehealth: Payer: Self-pay | Admitting: Family Medicine

## 2014-07-07 MED ORDER — ZOLPIDEM TARTRATE 10 MG PO TABS: ORAL_TABLET | ORAL | Status: DC

## 2014-07-07 NOTE — Telephone Encounter (Signed)
Okay to refill with 5 refills

## 2014-07-07 NOTE — Telephone Encounter (Signed)
Called in med #90 with 1 refill

## 2014-07-13 ENCOUNTER — Other Ambulatory Visit: Payer: Self-pay | Admitting: Family Medicine

## 2014-08-12 ENCOUNTER — Encounter: Payer: Self-pay | Admitting: Internal Medicine

## 2014-09-12 ENCOUNTER — Emergency Department (HOSPITAL_COMMUNITY)
Admission: EM | Admit: 2014-09-12 | Discharge: 2014-09-12 | Disposition: A | Discharge status: Home / Self Care | Payer: BC Managed Care – PPO | Source: Home / Self Care | Attending: Family Medicine | Admitting: Family Medicine

## 2014-09-12 ENCOUNTER — Encounter (HOSPITAL_COMMUNITY): Payer: Self-pay

## 2014-09-12 DIAGNOSIS — L03116 Cellulitis of left lower limb: Secondary | ICD-10-CM

## 2014-09-12 MED ORDER — CEPHALEXIN 500 MG PO CAPS: 500.0000 mg | ORAL_CAPSULE | Freq: Four times a day (QID) | ORAL | Status: DC

## 2014-09-12 NOTE — ED Notes (Signed)
"  I think something bit me on my left thigh last night ". Area noted , red swollen

## 2014-09-12 NOTE — ED Provider Notes (Signed)
CSN: [id number]     Arrival date & time 09/12/14  1828 History   First MD Initiated Contact with Patient 09/12/14 1851     Chief Complaint  Patient presents with  . Skin Problem   (Consider location/radiation/quality/duration/timing/severity/associated sxs/prior Treatment) HPI Comments: 2 day history of progressive redness, swelling and discomfort at left posterior thigh. Denies known injury or fever/chills. Thinks area began as small "hair bump" that he noticed in shower two nights ago.  PCP: Dr. Redmond School  The history is provided by the patient.    Past Medical History  Diagnosis Date  . Hypertension   . Arthritis   . Dyslipidemia   . HIV infection    History reviewed. No pertinent past surgical history. No family history on file. History  Substance Use Topics  . Smoking status: Never Smoker   . Smokeless tobacco: Not on file  . Alcohol Use: Not on file    Review of Systems  Allergic/Immunologic: Positive for immunocompromised state.  All other systems reviewed and are negative.   Allergies  Review of patient's allergies indicates no known allergies.  Home Medications   Prior to Admission medications   Medication Sig Start Date End Date Taking? Authorizing Provider  acetaminophen (TYLENOL) 325 MG tablet Take 650 mg by mouth every 6 (six) hours as needed.   Yes Historical Provider, MD  Elviteg-Cobicis-Emtricit-Tenof (STRIBILD PO) Take by mouth.   Yes Historical Provider, MD  lisinopril-hydrochlorothiazide (PRINZIDE,ZESTORETIC) 20-12.5 MG per tablet TAKE 1 TABLET BY MOUTH DAILY 07/13/14  Yes Denita Lung, MD  terazosin (HYTRIN) 1 MG capsule Take 1 capsule (1 mg total) by mouth at bedtime. 01/28/14  Yes Denita Lung, MD  zolpidem (AMBIEN) 10 MG tablet TAKE 1 TABLET BY MOUTH EVERY NIGHT AT BEDTIME AS NEEDED FOR SLEEP 07/07/14  Yes Denita Lung, MD  cephALEXin (KEFLEX) 500 MG capsule Take 1 capsule (500 mg total) by mouth 4 (four) times daily. X 7 days 09/12/14   Audelia Hives Ronita Hargreaves, PA  ibuprofen (ADVIL,MOTRIN) 200 MG tablet Take 200 mg by mouth every 6 (six) hours as needed for pain.    Historical Provider, MD   BP 159/76 mmHg  Pulse 85  Temp(Src) 98.2 F (36.8 C) (Oral)  Resp 14  SpO2 94% Physical Exam  Constitutional: He is oriented to person, place, and time. He appears well-developed and well-nourished. No distress.  HENT:  Head: Normocephalic and atraumatic.  Cardiovascular: Normal rate, regular rhythm and normal heart sounds.   Pulmonary/Chest: Effort normal and breath sounds normal.  Musculoskeletal: Normal range of motion.  Neurological: He is alert and oriented to person, place, and time.  Skin: Skin is warm and dry. There is erythema.  12 x 14 cm annular area of erythema and induration with palpable tenderness at left posterior thigh No central fluctuance  Psychiatric: He has a normal mood and affect. His behavior is normal.  Nursing note and vitals reviewed.   ED Course  Procedures (including critical care time) Labs Review Labs Reviewed - No data to display  Imaging Review No results found.   MDM   1. Cellulitis of left thigh   Area outlined and dated with surgical marker. Warm compresses QID until healed Keflex as prescribed with wound recheck in 48 hours either here or with PCP    Lutricia Feil, PA 09/13/14 1141

## 2014-09-12 NOTE — Discharge Instructions (Signed)
Warm compresses to affected area 3-4 times a day until healed. Cephalexin as prescribed. Follow up either here or with your doctor for wound re-check in 2 days.  Cellulitis Cellulitis is an infection of the skin and the tissue beneath it. The infected area is usually red and tender. Cellulitis occurs most often in the arms and lower legs.  CAUSES  Cellulitis is caused by bacteria that enter the skin through cracks or cuts in the skin. The most common types of bacteria that cause cellulitis are staphylococci and streptococci. SIGNS AND SYMPTOMS   Redness and warmth.  Swelling.  Tenderness or pain.  Fever. DIAGNOSIS  Your health care provider can usually determine what is wrong based on a physical exam. Blood tests may also be done. TREATMENT  Treatment usually involves taking an antibiotic medicine. HOME CARE INSTRUCTIONS   Take your antibiotic medicine as directed by your health care provider. Finish the antibiotic even if you start to feel better.  Keep the infected arm or leg elevated to reduce swelling.  Apply a warm cloth to the affected area up to 4 times per day to relieve pain.  Take medicines only as directed by your health care provider.  Keep all follow-up visits as directed by your health care provider. SEEK MEDICAL CARE IF:   You notice red streaks coming from the infected area.  Your red area gets larger or turns dark in color.  Your bone or joint underneath the infected area becomes painful after the skin has healed.  Your infection returns in the same area or another area.  You notice a swollen bump in the infected area.  You develop new symptoms.  You have a fever. SEEK IMMEDIATE MEDICAL CARE IF:   You feel very sleepy.  You develop vomiting or diarrhea.  You have a general ill feeling (malaise) with muscle aches and pains. MAKE SURE YOU:   Understand these instructions.  Will watch your condition.  Will get help right away if you are not  doing well or get worse. Document Released: 08/08/2005 Document Revised: 03/15/2014 Document Reviewed: 01/14/2012 [facility] Patient Information 2015 [place], [place]. This information is not intended to replace advice given to you by your health care provider. Make sure you discuss any questions you have with your health care provider.

## 2014-09-14 ENCOUNTER — Ambulatory Visit (INDEPENDENT_AMBULATORY_CARE_PROVIDER_SITE_OTHER): Payer: BC Managed Care – PPO | Admitting: Family Medicine

## 2014-09-14 ENCOUNTER — Encounter: Payer: Self-pay | Admitting: Family Medicine

## 2014-09-14 VITALS — BP 140/78 | HR 84 | Wt 192.0 lb

## 2014-09-14 DIAGNOSIS — L03116 Cellulitis of left lower limb: Secondary | ICD-10-CM

## 2014-09-14 DIAGNOSIS — M545 Low back pain: Secondary | ICD-10-CM

## 2014-09-14 NOTE — Progress Notes (Signed)
   Subjective:    Patient ID: Dale Watkins., male    DOB: April 24, 1956, 58 y.o.   MRN: [id number]  HPI He was seen recently in an urgent care center and placed on Keflex for treatment of cellulitis was probably secondary to an insect bite. He is here for follow-up on that. He states that it is doing much better. He also has a history of low back pain and has been getting epidural injections every 3 or 4 months with good results. His rheumatologist wants him to see a surgeon concerning possible intervention however he is not interested in this. He is very comfortable with the epidural injections.   Review of Systems     Objective:   Physical Exam Alert and in no distress. Exam of the left lateral thigh does show less swelling and erythema in the previously demarcated area.       Assessment & Plan:  Cellulitis of left lower extremity  Low back pain, unspecified back pain laterality, with sciatica presence unspecified he is to continue on the Keflex and call me if there is any question about returning to normal. I also reinforced the fact that since he is comfortable with the epidurals, I feel no need to look for surgical intervention.

## 2014-09-14 NOTE — Patient Instructions (Signed)
Take the rest of the medicine and if there is any question about whether you have returned to normal call me and I'll give you more

## 2014-09-17 ENCOUNTER — Telehealth: Payer: Self-pay | Admitting: Family Medicine

## 2014-09-17 MED ORDER — CEPHALEXIN 500 MG PO CAPS: 500.0000 mg | ORAL_CAPSULE | Freq: Four times a day (QID) | ORAL | Status: DC

## 2014-09-17 NOTE — Telephone Encounter (Signed)
Let him know that I renewed the antibiotic

## 2014-09-17 NOTE — Telephone Encounter (Signed)
Patient is aware 

## 2014-09-17 NOTE — Telephone Encounter (Signed)
Keflex renewed

## 2014-09-27 ENCOUNTER — Telehealth: Payer: Self-pay | Admitting: Family Medicine

## 2014-09-27 MED ORDER — CEPHALEXIN 500 MG PO CAPS: 500.0000 mg | ORAL_CAPSULE | Freq: Four times a day (QID) | ORAL | Status: DC

## 2014-09-27 NOTE — Telephone Encounter (Signed)
dt ?

## 2014-09-27 NOTE — Telephone Encounter (Signed)
Pt called and states cellulitis is about 70% better and would like another round of antibiotics sent to [facility] on [address].  Pt ph [phone]

## 2014-09-27 NOTE — Telephone Encounter (Signed)
Keflex was called in per [facility] notes

## 2014-09-30 ENCOUNTER — Other Ambulatory Visit: Payer: Self-pay | Admitting: Rheumatology

## 2014-09-30 DIAGNOSIS — M545 Low back pain, unspecified: Secondary | ICD-10-CM

## 2014-09-30 DIAGNOSIS — G8929 Other chronic pain: Secondary | ICD-10-CM

## 2014-10-04 ENCOUNTER — Ambulatory Visit
Admission: RE | Admit: 2014-10-04 | Discharge: 2014-10-04 | Disposition: A | Discharge status: Home / Self Care | Payer: BC Managed Care – PPO | Source: Ambulatory Visit | Attending: Rheumatology | Admitting: Rheumatology

## 2014-10-04 DIAGNOSIS — G8929 Other chronic pain: Secondary | ICD-10-CM

## 2014-10-04 DIAGNOSIS — M545 Low back pain, unspecified: Secondary | ICD-10-CM

## 2014-10-04 MED ORDER — METHYLPREDNISOLONE ACETATE 40 MG/ML INJ SUSP (RADIOLOG
120.0000 mg | Freq: Once | INTRAMUSCULAR | Status: AC
  Administered 2014-10-04: 120 mg via EPIDURAL

## 2014-10-04 MED ORDER — IOHEXOL 180 MG/ML  SOLN
1.0000 mL | Freq: Once | INTRAMUSCULAR | Status: AC | PRN
  Administered 2014-10-04: 1 mL via EPIDURAL

## 2014-11-20 ENCOUNTER — Other Ambulatory Visit: Payer: Self-pay | Admitting: Family Medicine

## 2015-01-07 ENCOUNTER — Other Ambulatory Visit: Payer: Self-pay | Admitting: Family Medicine

## 2015-01-07 NOTE — Telephone Encounter (Signed)
Is this okay to refill? 

## 2015-01-07 NOTE — Telephone Encounter (Signed)
Ok to renew?  

## 2015-01-10 ENCOUNTER — Other Ambulatory Visit: Payer: Self-pay

## 2015-01-10 NOTE — Telephone Encounter (Signed)
Is this okay?

## 2015-01-10 NOTE — Telephone Encounter (Signed)
Okay to renew

## 2015-01-10 NOTE — Telephone Encounter (Signed)
Called in ambien 

## 2015-01-20 ENCOUNTER — Telehealth: Payer: Self-pay | Admitting: Family Medicine

## 2015-01-20 NOTE — Telephone Encounter (Signed)
Requesting referral to Dr Jobe Igo @ [facility] to get lumbar injections. Pt says he spoke to Dr Redmond School about this previously

## 2015-01-20 NOTE — Telephone Encounter (Signed)
Left a message with Andee Poles at [facility] imaging about this patient wanting lumbar injections

## 2015-01-20 NOTE — Telephone Encounter (Signed)
Set this up 

## 2015-01-27 ENCOUNTER — Other Ambulatory Visit: Payer: Self-pay | Admitting: Family Medicine

## 2015-01-27 DIAGNOSIS — M545 Low back pain, unspecified: Secondary | ICD-10-CM

## 2015-01-27 DIAGNOSIS — G8929 Other chronic pain: Secondary | ICD-10-CM

## 2015-02-01 NOTE — Telephone Encounter (Signed)
Looks like pt has an appt 3/24 at [facility]

## 2015-02-02 ENCOUNTER — Telehealth: Payer: Self-pay | Admitting: Internal Medicine

## 2015-02-02 DIAGNOSIS — M545 Low back pain: Secondary | ICD-10-CM

## 2015-02-02 NOTE — Telephone Encounter (Signed)
Dale [facility] at [facility] called to ask to put order in for tomorrow for pt to get injection done

## 2015-02-03 ENCOUNTER — Ambulatory Visit
Admission: RE | Admit: 2015-02-03 | Discharge: 2015-02-03 | Disposition: A | Discharge status: Home / Self Care | Payer: BLUE CROSS/BLUE SHIELD | Source: Ambulatory Visit | Attending: Family Medicine | Admitting: Family Medicine

## 2015-02-03 DIAGNOSIS — G8929 Other chronic pain: Secondary | ICD-10-CM

## 2015-02-03 DIAGNOSIS — M545 Low back pain, unspecified: Secondary | ICD-10-CM

## 2015-02-03 MED ORDER — IOHEXOL 180 MG/ML  SOLN
1.0000 mL | Freq: Once | INTRAMUSCULAR | Status: AC | PRN
  Administered 2015-02-03: 1 mL via EPIDURAL

## 2015-02-03 MED ORDER — METHYLPREDNISOLONE ACETATE 40 MG/ML INJ SUSP (RADIOLOG
120.0000 mg | Freq: Once | INTRAMUSCULAR | Status: AC
  Administered 2015-02-03: 120 mg via EPIDURAL

## 2015-04-06 ENCOUNTER — Other Ambulatory Visit: Payer: Self-pay | Admitting: Family Medicine

## 2015-04-06 NOTE — Telephone Encounter (Signed)
ok 

## 2015-04-06 NOTE — Telephone Encounter (Signed)
Is this okay to call in? 

## 2015-04-08 NOTE — Telephone Encounter (Signed)
Phoned in.

## 2015-05-06 ENCOUNTER — Encounter: Payer: Self-pay | Admitting: Internal Medicine

## 2015-09-22 ENCOUNTER — Other Ambulatory Visit: Payer: Self-pay | Admitting: Internal Medicine

## 2015-09-22 DIAGNOSIS — G8929 Other chronic pain: Secondary | ICD-10-CM

## 2015-09-22 DIAGNOSIS — M5126 Other intervertebral disc displacement, lumbar region: Secondary | ICD-10-CM

## 2015-09-22 DIAGNOSIS — M545 Low back pain: Principal | ICD-10-CM

## 2015-09-26 ENCOUNTER — Encounter: Payer: Self-pay | Admitting: Internal Medicine

## 2015-10-02 ENCOUNTER — Other Ambulatory Visit: Payer: Self-pay | Admitting: Family Medicine

## 2015-10-03 ENCOUNTER — Ambulatory Visit
Admission: RE | Admit: 2015-10-03 | Discharge: 2015-10-03 | Disposition: A | Discharge status: Home / Self Care | Payer: BLUE CROSS/BLUE SHIELD | Source: Ambulatory Visit | Attending: Internal Medicine | Admitting: Internal Medicine

## 2015-10-03 ENCOUNTER — Other Ambulatory Visit: Payer: Self-pay

## 2015-10-03 DIAGNOSIS — M545 Low back pain: Principal | ICD-10-CM

## 2015-10-03 DIAGNOSIS — G8929 Other chronic pain: Secondary | ICD-10-CM

## 2015-10-03 DIAGNOSIS — M5126 Other intervertebral disc displacement, lumbar region: Secondary | ICD-10-CM

## 2015-10-03 MED ORDER — METHYLPREDNISOLONE ACETATE 40 MG/ML INJ SUSP (RADIOLOG
120.0000 mg | Freq: Once | INTRAMUSCULAR | Status: AC
  Administered 2015-10-03: 120 mg via EPIDURAL

## 2015-10-03 MED ORDER — IOHEXOL 180 MG/ML  SOLN
1.0000 mL | Freq: Once | INTRAMUSCULAR | Status: DC | PRN
  Administered 2015-10-03: 1 mL via EPIDURAL

## 2015-10-03 NOTE — Telephone Encounter (Signed)
Okay to renew

## 2015-10-03 NOTE — Discharge Instructions (Signed)

## 2015-10-03 NOTE — Telephone Encounter (Signed)
Is this okay to refill? 

## 2015-10-19 ENCOUNTER — Other Ambulatory Visit: Payer: Self-pay | Admitting: Internal Medicine

## 2015-10-19 ENCOUNTER — Encounter: Payer: Self-pay | Admitting: Internal Medicine

## 2015-10-19 DIAGNOSIS — I701 Atherosclerosis of renal artery: Secondary | ICD-10-CM

## 2015-11-01 DIAGNOSIS — F411 Generalized anxiety disorder: Secondary | ICD-10-CM | POA: Insufficient documentation

## 2015-11-01 DIAGNOSIS — R0789 Other chest pain: Secondary | ICD-10-CM | POA: Insufficient documentation

## 2015-11-18 ENCOUNTER — Encounter: Payer: Self-pay | Admitting: Family Medicine

## 2015-11-18 ENCOUNTER — Ambulatory Visit (INDEPENDENT_AMBULATORY_CARE_PROVIDER_SITE_OTHER): Payer: BLUE CROSS/BLUE SHIELD | Admitting: Family Medicine

## 2015-11-18 VITALS — BP 160/80 | HR 95 | Ht 67.25 in | Wt 200.0 lb

## 2015-11-18 DIAGNOSIS — I10 Essential (primary) hypertension: Secondary | ICD-10-CM | POA: Diagnosis not present

## 2015-11-18 MED ORDER — LISINOPRIL-HYDROCHLOROTHIAZIDE 20-12.5 MG PO TABS: 1.0000 | ORAL_TABLET | Freq: Every day | ORAL | Status: DC

## 2015-11-18 NOTE — Progress Notes (Signed)
   Subjective:    Patient ID: Dale Coupe., male    DOB: 1956-05-16, 60 y.o.   MRN: [id number]  HPI He is here for a consultation concerning his hypertension. He has not been seen here since March but has been seeing a physician in [place]. He has been tried on various medications most recently, Toprol-XL and he has not tolerated that well causing difficulty with headache and red skin. He also was given tramadol and amlodipine and most recently given chlorthalidone. Approximately 2 weeks ago when his medications were switched he noted trouble with dependent edema. Past he had been on lisinopril and apparently did fairly well on this. Review his record also indicates that he was given losartan. Review of Systems     Objective:   Physical Exam Alert and in no distress. Exam of both extremities does show 2+ pitting edema in his feet.       Assessment & Plan:  Essential hypertension - Plan: lisinopril-hydrochlorothiazide (PRINZIDE,ZESTORETIC) 20-12.5 MG tablet  I will have him taper off the Toprol and stop chlorthalidone and REM a pro. He will continue on amlodipine. I will place him back on lisinopril 20/12.5. Recheck here in 6 weeks. Explained that it might take a while to get rid of the swelling and deathly recommended that he use support stockings in the meantime.

## 2015-11-18 NOTE — Progress Notes (Signed)
   Subjective:    Patient ID: Dale Coupe., male    DOB: 1956-07-05, 60 y.o.   MRN: [id number]  HPI    Review of Systems     Objective:   Physical Exam        Assessment & Plan:  As part of his work up for the hypertension he did have EKG , blood work and renal ultrasound , all  of which as  negative

## 2015-11-29 DIAGNOSIS — R600 Localized edema: Secondary | ICD-10-CM | POA: Insufficient documentation

## 2015-11-30 ENCOUNTER — Ambulatory Visit (AMBULATORY_SURGERY_CENTER): Payer: Self-pay

## 2015-11-30 VITALS — Ht 67.5 in | Wt 196.6 lb

## 2015-11-30 DIAGNOSIS — Z860100 Personal history of colon polyps, unspecified: Secondary | ICD-10-CM

## 2015-11-30 NOTE — Progress Notes (Signed)
No allergies to eggs or soy No past problems with anesthesia No diet/weight loss meds No home oxygen  Has email and internet; refused emmi 

## 2015-12-02 ENCOUNTER — Encounter: Payer: Self-pay | Admitting: Family Medicine

## 2015-12-14 ENCOUNTER — Encounter: Payer: Self-pay | Admitting: Internal Medicine

## 2015-12-14 ENCOUNTER — Ambulatory Visit (AMBULATORY_SURGERY_CENTER): Payer: BLUE CROSS/BLUE SHIELD | Admitting: Internal Medicine

## 2015-12-14 VITALS — BP 137/75 | HR 67 | Temp 98.0°F | Resp 22 | Ht 67.5 in | Wt 196.0 lb

## 2015-12-14 DIAGNOSIS — Z86010 Personal history of colonic polyps: Secondary | ICD-10-CM | POA: Diagnosis present

## 2015-12-14 MED ORDER — SODIUM CHLORIDE 0.9 % IV SOLN: 500.0000 mL | INTRAVENOUS | Status: DC

## 2015-12-14 NOTE — Patient Instructions (Signed)
YOU HAD AN ENDOSCOPIC PROCEDURE TODAY AT THE [facility]:   Refer to the procedure report that was given to you for any specific questions about what was found during the examination.  If the procedure report does not answer your questions, please call your gastroenterologist to clarify.  If you requested that your care partner not be given the details of your procedure findings, then the procedure report has been included in a sealed envelope for you to review at your convenience later.  YOU SHOULD EXPECT: Some feelings of bloating in the abdomen. Passage of more gas than usual.  Walking can help get rid of the air that was put into your GI tract during the procedure and reduce the bloating. If you had a lower endoscopy (such as a colonoscopy or flexible sigmoidoscopy) you may notice spotting of blood in your stool or on the toilet paper. If you underwent a bowel prep for your procedure, you may not have a normal bowel movement for a few days.  Please Note:  You might notice some irritation and congestion in your nose or some drainage.  This is from the oxygen used during your procedure.  There is no need for concern and it should clear up in a day or so.  SYMPTOMS TO REPORT IMMEDIATELY:   Following lower endoscopy (colonoscopy or flexible sigmoidoscopy):  Excessive amounts of blood in the stool  Significant tenderness or worsening of abdominal pains  Swelling of the abdomen that is new, acute  Fever of 100F or higher   For urgent or emergent issues, a gastroenterologist can be reached at any hour by calling [phone].   DIET: Your first meal following the procedure should be a small meal and then it is ok to progress to your normal diet. Heavy or fried foods are harder to digest and may make you feel nauseous or bloated.  Likewise, meals heavy in dairy and vegetables can increase bloating.  Drink plenty of fluids but you should avoid alcoholic beverages for 24  hours.  ACTIVITY:  You should plan to take it easy for the rest of today and you should NOT DRIVE or use heavy machinery until tomorrow (because of the sedation medicines used during the test).    FOLLOW UP: Our staff will call the number listed on your records the next business day following your procedure to check on you and address any questions or concerns that you may have regarding the information given to you following your procedure. If we do not reach you, we will leave a message.  However, if you are feeling well and you are not experiencing any problems, there is no need to return our call.  We will assume that you have returned to your regular daily activities without incident.  If any biopsies were taken you will be contacted by phone or by letter within the next 1-3 weeks.  Please call us at [phone] if you have not heard about the biopsies in 3 weeks.    SIGNATURES/CONFIDENTIALITY: You and/or your care partner have signed paperwork which will be entered into your electronic medical record.  These signatures attest to the fact that that the information above on your After Visit Summary has been reviewed and is understood.  Full responsibility of the confidentiality of this discharge information lies with you and/or your care-partner.   Resume medications. 

## 2015-12-14 NOTE — Progress Notes (Signed)
A/ox3 pleased with MAC, report to Sheila RN 

## 2015-12-14 NOTE — Op Note (Signed)
[facility]  [address]. [place], [address]   COLONOSCOPY PROCEDURE REPORT  PATIENT: Dale Watkins, Dale Watkins  MR#: [id number] BIRTHDATE: 29-Dec-1955 , 4  yrs. old GENDER: male ENDOSCOPIST: Eustace Quail, MD REFERRED [id number] Program Recall PROCEDURE DATE:  12/14/2015 PROCEDURE:   Colonoscopy, surveillance First Screening Colonoscopy - Avg.  risk and is 50 yrs.  old or older - No.  Prior Negative Screening - Now for repeat screening. N/A  History of Adenoma - Now for follow-up colonoscopy & has been > or = to 3 yrs.  Yes hx of adenoma.  Has been 3 or more years since last colonoscopy.  Polyps removed today? No Recommend repeat exam, <10 yrs? No ASA CLASS:   Class II INDICATIONS:Surveillance due to prior colonic neoplasia and PH Colon Adenoma. Index examination August 2009 with small tubular adenoma. Had interval colonoscopy November 2011 to evaluate anemia. No polyps. MEDICATIONS: Monitored anesthesia care and Propofol 300 mg IV  DESCRIPTION OF PROCEDURE:   After the risks benefits and alternatives of the procedure were thoroughly explained, informed consent was obtained.  The digital rectal exam revealed no abnormalities of the rectum.   The [id number]  endoscope was introduced through the anus and advanced to the cecum, which was identified by both the appendix and ileocecal valve. No adverse events experienced.   The quality of the prep was excellent. (Suprep was used)  The instrument was then slowly withdrawn as the colon was fully examined. Estimated blood loss is zero unless otherwise noted in this procedure report.      COLON FINDINGS: A normal appearing cecum, ileocecal valve, and appendiceal orifice were identified.  The ascending, transverse, descending, sigmoid colon, and rectum appeared unremarkable. Retroflexed views revealed internal hemorrhoids. The time to cecum = 1.4 Withdrawal time = 9.9   The scope was withdrawn and  the procedure completed. COMPLICATIONS: There were no immediate complications.  ENDOSCOPIC IMPRESSION: 1. Normal colonoscopy  RECOMMENDATIONS: 1. Continue current colorectal surveillance recommendations with a repeat colonoscopy in 10 years.  eSigned:  Eustace Quail, MD 12/14/2015 12:05 PM   cc: The Patient and Jill Alexanders, MD

## 2015-12-15 ENCOUNTER — Telehealth: Payer: Self-pay | Admitting: *Deleted

## 2015-12-15 NOTE — Telephone Encounter (Signed)
  Follow up Call-  Call back number 12/14/2015  Post procedure Call Back phone  # [phone]  Permission to leave phone message Yes     Patient questions:  Do you have a fever, pain , or abdominal swelling? No. Pain Score  0 *  Have you tolerated food without any problems? Yes.    Have you been able to return to your normal activities? Yes.    Do you have any questions about your discharge instructions: Diet   No. Medications  No. Follow up visit  No.  Do you have questions or concerns about your Care? No.  Actions: * If pain score is 4 or above: No action needed, pain <4.

## 2015-12-30 ENCOUNTER — Ambulatory Visit (INDEPENDENT_AMBULATORY_CARE_PROVIDER_SITE_OTHER): Payer: BLUE CROSS/BLUE SHIELD | Admitting: Family Medicine

## 2015-12-30 ENCOUNTER — Encounter: Payer: Self-pay | Admitting: Family Medicine

## 2015-12-30 VITALS — BP 170/90 | HR 87 | Ht 67.5 in | Wt 197.6 lb

## 2015-12-30 DIAGNOSIS — I10 Essential (primary) hypertension: Secondary | ICD-10-CM

## 2015-12-30 NOTE — Progress Notes (Signed)
   Subjective:    Patient ID: Dale Watkins., male    DOB: 21-Feb-1956, 60 y.o.   MRN: [id number]  HPI Here for recheck. Since stopping his other blood pressure medications, a lot of the side effects of gone away specifically peripheral edema, redness of the face and slight malaise. He has been checking his blood pressure at home and they have been below 130/80. His cuff has been checked against an accurate machine and therefore is valid.   Review of Systems     Objective:   Physical Exam Alert and in no distress. Blood pressure here is elevated.       Assessment & Plan:  Essential hypertension I told him that we will pay attention to his blood pressures at home since it seems to be quite accurate and he easily could have a component of whitecoat hypertension. He is comfortable with this.

## 2016-02-10 ENCOUNTER — Other Ambulatory Visit: Payer: Self-pay | Admitting: Family Medicine

## 2016-03-08 ENCOUNTER — Other Ambulatory Visit: Payer: Self-pay | Admitting: Internal Medicine

## 2016-03-08 DIAGNOSIS — M5440 Lumbago with sciatica, unspecified side: Secondary | ICD-10-CM

## 2016-03-22 ENCOUNTER — Ambulatory Visit
Admission: RE | Admit: 2016-03-22 | Discharge: 2016-03-22 | Disposition: A | Discharge status: Home / Self Care | Payer: BLUE CROSS/BLUE SHIELD | Source: Ambulatory Visit | Attending: Internal Medicine | Admitting: Internal Medicine

## 2016-03-22 DIAGNOSIS — M5440 Lumbago with sciatica, unspecified side: Secondary | ICD-10-CM

## 2016-03-22 DIAGNOSIS — G4700 Insomnia, unspecified: Secondary | ICD-10-CM | POA: Insufficient documentation

## 2016-03-22 MED ORDER — IOPAMIDOL (ISOVUE-M 200) INJECTION 41%
1.0000 mL | Freq: Once | INTRAMUSCULAR | Status: AC
  Administered 2016-03-22: 1 mL via EPIDURAL

## 2016-03-22 MED ORDER — METHYLPREDNISOLONE ACETATE 40 MG/ML INJ SUSP (RADIOLOG
120.0000 mg | Freq: Once | INTRAMUSCULAR | Status: AC
  Administered 2016-03-22: 120 mg via EPIDURAL

## 2016-04-11 ENCOUNTER — Other Ambulatory Visit: Payer: Self-pay | Admitting: Family Medicine

## 2016-04-11 ENCOUNTER — Other Ambulatory Visit: Payer: Self-pay

## 2016-04-11 NOTE — Telephone Encounter (Signed)
Pt called requesting refill of Ambien, states script expired at pharmacy on 04/02/2016- [address]. [place] December

## 2016-04-11 NOTE — Telephone Encounter (Signed)
Called med in 

## 2016-04-11 NOTE — Telephone Encounter (Signed)
Call the medicine in

## 2016-04-11 NOTE — Telephone Encounter (Signed)
Is this okay to refill? 

## 2016-06-15 ENCOUNTER — Other Ambulatory Visit: Payer: Self-pay | Admitting: Internal Medicine

## 2016-06-15 DIAGNOSIS — M545 Low back pain: Secondary | ICD-10-CM

## 2016-07-06 ENCOUNTER — Telehealth: Payer: Self-pay

## 2016-07-06 MED ORDER — ZOLPIDEM TARTRATE 10 MG PO TABS: 10.0000 mg | ORAL_TABLET | Freq: Every evening | ORAL | 0 refills | Status: DC | PRN

## 2016-07-06 NOTE — Telephone Encounter (Signed)
Okay 

## 2016-07-06 NOTE — Telephone Encounter (Signed)
Pt needs refill of ambien sent to [facility] on [address].

## 2016-07-06 NOTE — Telephone Encounter (Signed)
Called in med to pharmcy

## 2016-07-09 ENCOUNTER — Ambulatory Visit
Admission: RE | Admit: 2016-07-09 | Discharge: 2016-07-09 | Disposition: A | Discharge status: Home / Self Care | Payer: BLUE CROSS/BLUE SHIELD | Source: Ambulatory Visit | Attending: Internal Medicine | Admitting: Internal Medicine

## 2016-07-09 DIAGNOSIS — M545 Low back pain: Secondary | ICD-10-CM

## 2016-07-09 MED ORDER — IOPAMIDOL (ISOVUE-M 200) INJECTION 41%
1.0000 mL | Freq: Once | INTRAMUSCULAR | Status: AC
  Administered 2016-07-09: 1 mL via EPIDURAL

## 2016-07-09 MED ORDER — METHYLPREDNISOLONE ACETATE 40 MG/ML INJ SUSP (RADIOLOG
120.0000 mg | Freq: Once | INTRAMUSCULAR | Status: AC
  Administered 2016-07-09: 120 mg via EPIDURAL

## 2016-10-03 ENCOUNTER — Telehealth: Payer: Self-pay

## 2016-10-03 ENCOUNTER — Other Ambulatory Visit: Payer: Self-pay

## 2016-10-03 MED ORDER — ZOLPIDEM TARTRATE 10 MG PO TABS: 10.0000 mg | ORAL_TABLET | Freq: Every evening | ORAL | 0 refills | Status: DC | PRN

## 2016-10-03 NOTE — Telephone Encounter (Signed)
Fax request for Ambien recvd from [name]. Dale Watkins

## 2016-10-03 NOTE — Telephone Encounter (Signed)
Called ambien in per jcl 

## 2016-10-03 NOTE — Telephone Encounter (Signed)
Okay to renew

## 2016-10-08 ENCOUNTER — Other Ambulatory Visit: Payer: Self-pay | Admitting: Internal Medicine

## 2016-10-08 DIAGNOSIS — G8929 Other chronic pain: Secondary | ICD-10-CM

## 2016-10-08 DIAGNOSIS — M545 Low back pain: Principal | ICD-10-CM

## 2016-10-25 ENCOUNTER — Ambulatory Visit
Admission: RE | Admit: 2016-10-25 | Discharge: 2016-10-25 | Disposition: A | Discharge status: Home / Self Care | Payer: BLUE CROSS/BLUE SHIELD | Source: Ambulatory Visit | Attending: Internal Medicine | Admitting: Internal Medicine

## 2016-10-25 DIAGNOSIS — M545 Low back pain: Principal | ICD-10-CM

## 2016-10-25 DIAGNOSIS — G8929 Other chronic pain: Secondary | ICD-10-CM

## 2016-10-25 MED ORDER — IOPAMIDOL (ISOVUE-M 200) INJECTION 41%
1.0000 mL | Freq: Once | INTRAMUSCULAR | Status: AC
  Administered 2016-10-25: 1 mL via EPIDURAL

## 2016-10-25 MED ORDER — METHYLPREDNISOLONE ACETATE 40 MG/ML INJ SUSP (RADIOLOG
120.0000 mg | Freq: Once | INTRAMUSCULAR | Status: AC
  Administered 2016-10-25: 120 mg via EPIDURAL

## 2016-12-31 ENCOUNTER — Other Ambulatory Visit: Payer: Self-pay | Admitting: Family Medicine

## 2016-12-31 NOTE — Telephone Encounter (Signed)
Is this okay to refill? 

## 2016-12-31 NOTE — Telephone Encounter (Signed)
Called in.

## 2016-12-31 NOTE — Telephone Encounter (Signed)
ok 

## 2017-02-01 ENCOUNTER — Other Ambulatory Visit: Payer: Self-pay | Admitting: Internal Medicine

## 2017-02-01 DIAGNOSIS — M545 Low back pain: Principal | ICD-10-CM

## 2017-02-01 DIAGNOSIS — G8929 Other chronic pain: Secondary | ICD-10-CM

## 2017-02-17 IMAGING — XA Imaging study
2 series · 2 of 2 positions shown · non-contrast
Comparison: none

CLINICAL DATA: Lumbosacral spondylosis. Displacement of the L4-5
lumbar disc. Right-sided radicular symptoms. The patient did well
following a previous epidural steroid injection 3.5 months ago with
80% relief. The pain has slowly recurred.

[Series 1: ortho standard · 1 of 1 slices shown (1 of 2)]
[im 1/1]
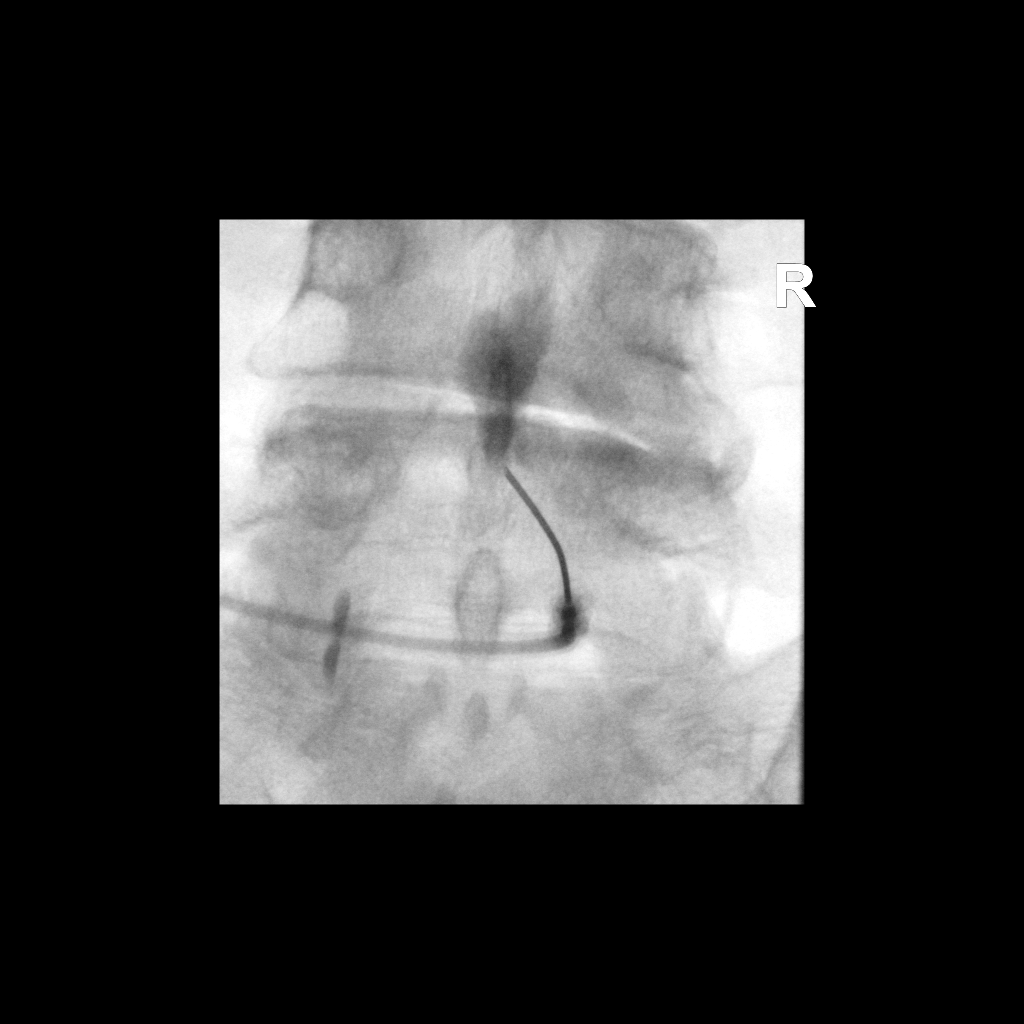

[Series 2: ortho standard · 1 of 1 slices shown (2 of 2)]
[im 1/1]
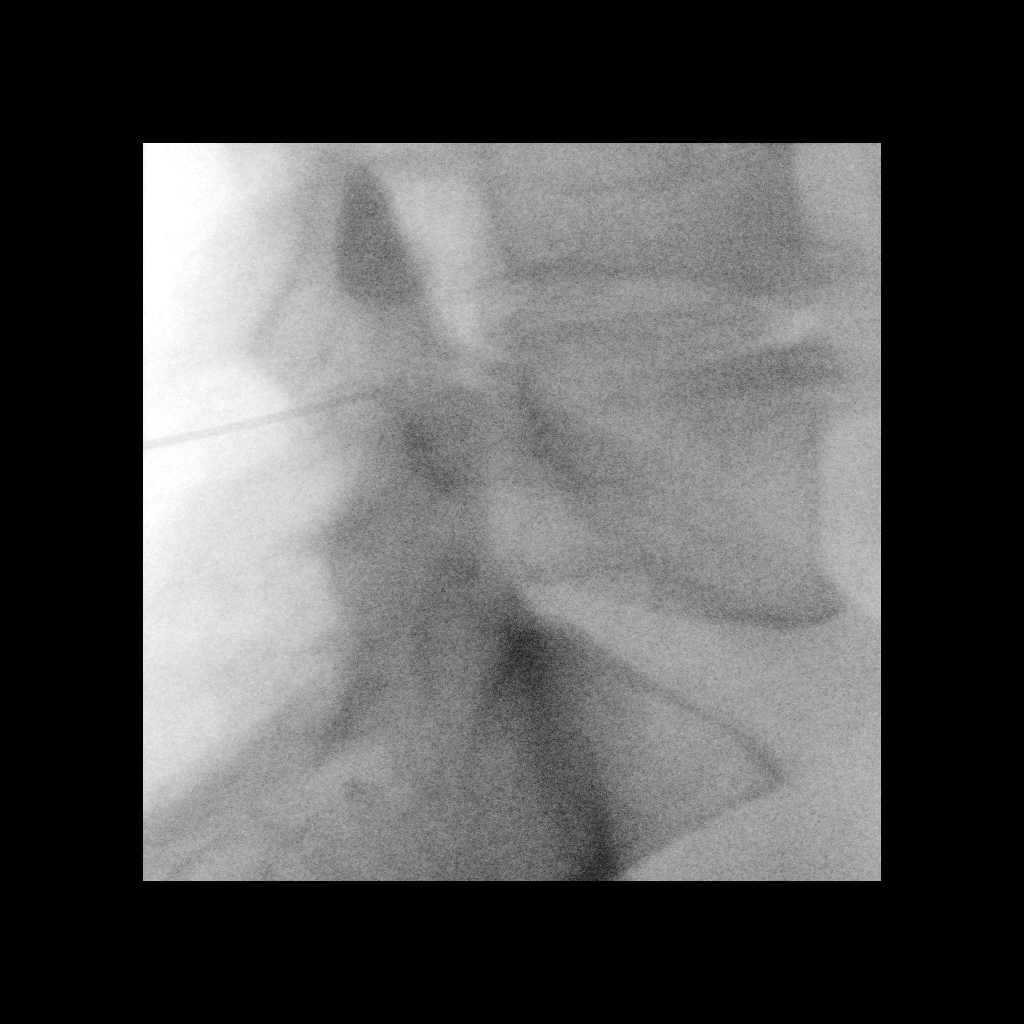

[2 of 2 positions shown; findings below may reference images not displayed]

FLUOROSCOPY TIME:  37.46 uGy*m2

PROCEDURE:
The procedure, risks, benefits, and alternatives were explained to
the patient. Questions regarding the procedure were encouraged and
answered. The patient understands and consents to the procedure.

LUMBAR EPIDURAL INJECTION:

An interlaminar approach was performed on right at L4-5. The
overlying skin was cleansed and anesthetized. A 20 gauge epidural
needle was advanced using loss-of-resistance technique.

DIAGNOSTIC EPIDURAL INJECTION:

Injection of Isovue-M 200 shows a good epidural pattern with spread
above and below the level of needle placement, primarily on the
right no vascular opacification is seen.

THERAPEUTIC EPIDURAL INJECTION:

120 Mg of Depo-Medrol mixed with 3 mL 1% lidocaine were instilled.
The procedure was well-tolerated, and the patient was discharged
thirty minutes following the injection in good condition.

COMPLICATIONS:
None
IMPRESSION: Technically successful epidural injection on the right L4-5 # 2

## 2017-02-19 ENCOUNTER — Other Ambulatory Visit: Payer: BLUE CROSS/BLUE SHIELD

## 2017-02-28 ENCOUNTER — Inpatient Hospital Stay
Admission: RE | Admit: 2017-02-28 | Discharge: 2017-02-28 | Disposition: A | Discharge status: Home / Self Care | Payer: BLUE CROSS/BLUE SHIELD | Source: Ambulatory Visit | Attending: Internal Medicine | Admitting: Internal Medicine

## 2017-03-04 ENCOUNTER — Other Ambulatory Visit: Payer: BLUE CROSS/BLUE SHIELD

## 2017-03-11 ENCOUNTER — Ambulatory Visit
Admission: RE | Admit: 2017-03-11 | Discharge: 2017-03-11 | Disposition: A | Discharge status: Home / Self Care | Payer: BLUE CROSS/BLUE SHIELD | Source: Ambulatory Visit | Attending: Internal Medicine | Admitting: Internal Medicine

## 2017-03-11 DIAGNOSIS — M545 Low back pain: Principal | ICD-10-CM

## 2017-03-11 DIAGNOSIS — G8929 Other chronic pain: Secondary | ICD-10-CM

## 2017-03-11 MED ORDER — METHYLPREDNISOLONE ACETATE 40 MG/ML INJ SUSP (RADIOLOG
120.0000 mg | Freq: Once | INTRAMUSCULAR | Status: AC
  Administered 2017-03-11: 120 mg via EPIDURAL

## 2017-03-11 MED ORDER — IOPAMIDOL (ISOVUE-M 200) INJECTION 41%
1.0000 mL | Freq: Once | INTRAMUSCULAR | Status: AC
  Administered 2017-03-11: 1 mL via EPIDURAL

## 2017-03-13 ENCOUNTER — Other Ambulatory Visit: Payer: Self-pay | Admitting: Internal Medicine

## 2017-03-13 DIAGNOSIS — N289 Disorder of kidney and ureter, unspecified: Secondary | ICD-10-CM

## 2017-03-18 ENCOUNTER — Ambulatory Visit
Admission: RE | Admit: 2017-03-18 | Discharge: 2017-03-18 | Disposition: A | Discharge status: Home / Self Care | Payer: BLUE CROSS/BLUE SHIELD | Source: Ambulatory Visit | Attending: Internal Medicine | Admitting: Internal Medicine

## 2017-03-18 DIAGNOSIS — N289 Disorder of kidney and ureter, unspecified: Secondary | ICD-10-CM

## 2017-03-19 ENCOUNTER — Other Ambulatory Visit: Payer: BLUE CROSS/BLUE SHIELD

## 2017-06-12 ENCOUNTER — Other Ambulatory Visit: Payer: Self-pay | Admitting: Internal Medicine

## 2017-06-12 DIAGNOSIS — M545 Low back pain: Principal | ICD-10-CM

## 2017-06-12 DIAGNOSIS — G8929 Other chronic pain: Secondary | ICD-10-CM

## 2017-06-20 ENCOUNTER — Ambulatory Visit
Admission: RE | Admit: 2017-06-20 | Discharge: 2017-06-20 | Disposition: A | Discharge status: Home / Self Care | Payer: BLUE CROSS/BLUE SHIELD | Source: Ambulatory Visit | Attending: Internal Medicine | Admitting: Internal Medicine

## 2017-06-20 DIAGNOSIS — M545 Low back pain: Principal | ICD-10-CM

## 2017-06-20 DIAGNOSIS — G8929 Other chronic pain: Secondary | ICD-10-CM

## 2017-06-20 MED ORDER — METHYLPREDNISOLONE ACETATE 40 MG/ML INJ SUSP (RADIOLOG
120.0000 mg | Freq: Once | INTRAMUSCULAR | Status: AC
  Administered 2017-06-20: 120 mg via EPIDURAL

## 2017-06-20 MED ORDER — IOPAMIDOL (ISOVUE-M 200) INJECTION 41%
1.0000 mL | Freq: Once | INTRAMUSCULAR | Status: AC
  Administered 2017-06-20: 1 mL via EPIDURAL

## 2017-06-26 ENCOUNTER — Other Ambulatory Visit: Payer: Self-pay | Admitting: Family Medicine

## 2017-06-26 NOTE — Telephone Encounter (Signed)
Is this okay to refill? 

## 2017-06-29 NOTE — Telephone Encounter (Signed)
Okay 

## 2017-07-01 NOTE — Telephone Encounter (Signed)
Called in meds

## 2017-09-24 ENCOUNTER — Other Ambulatory Visit: Payer: Self-pay | Admitting: Internal Medicine

## 2017-09-24 DIAGNOSIS — M545 Low back pain: Principal | ICD-10-CM

## 2017-09-24 DIAGNOSIS — G8929 Other chronic pain: Secondary | ICD-10-CM

## 2017-10-14 ENCOUNTER — Ambulatory Visit
Admission: RE | Admit: 2017-10-14 | Discharge: 2017-10-14 | Disposition: A | Discharge status: Home / Self Care | Payer: BLUE CROSS/BLUE SHIELD | Source: Ambulatory Visit | Attending: Internal Medicine | Admitting: Internal Medicine

## 2017-10-14 DIAGNOSIS — M545 Low back pain: Principal | ICD-10-CM

## 2017-10-14 DIAGNOSIS — G8929 Other chronic pain: Secondary | ICD-10-CM

## 2017-10-14 MED ORDER — METHYLPREDNISOLONE ACETATE 40 MG/ML INJ SUSP (RADIOLOG
120.0000 mg | Freq: Once | INTRAMUSCULAR | Status: AC
  Administered 2017-10-14: 120 mg via EPIDURAL

## 2017-10-14 MED ORDER — IOPAMIDOL (ISOVUE-M 200) INJECTION 41%
1.0000 mL | Freq: Once | INTRAMUSCULAR | Status: AC
  Administered 2017-10-14: 1 mL via EPIDURAL

## 2017-12-25 ENCOUNTER — Other Ambulatory Visit: Payer: Self-pay | Admitting: Family Medicine

## 2017-12-25 NOTE — Telephone Encounter (Signed)
Is pt Dale Watkins ok to be fill . Pt was called to see if we could schedule an appt and pt says he will call next week todo so. Please advise. Thanks [employer]

## 2018-03-25 ENCOUNTER — Other Ambulatory Visit: Payer: Self-pay | Admitting: Family Medicine

## 2018-03-25 NOTE — Telephone Encounter (Signed)
Called pt back he says he has about a week let and he will call bck the first of next week to make an appt. [place]

## 2018-03-25 NOTE — Telephone Encounter (Signed)
Dale Watkins is requesting to fill pt ambien. Please advise [facility]

## 2018-03-25 NOTE — Telephone Encounter (Signed)
It is time for an appointment

## 2018-03-31 ENCOUNTER — Encounter: Payer: Self-pay | Admitting: Family Medicine

## 2018-03-31 ENCOUNTER — Ambulatory Visit: Payer: BLUE CROSS/BLUE SHIELD | Admitting: Family Medicine

## 2018-03-31 VITALS — BP 162/90 | HR 70 | Temp 98.0°F | Ht 68.0 in | Wt 194.4 lb

## 2018-03-31 DIAGNOSIS — F5104 Psychophysiologic insomnia: Secondary | ICD-10-CM

## 2018-03-31 DIAGNOSIS — Z21 Asymptomatic human immunodeficiency virus [HIV] infection status: Secondary | ICD-10-CM

## 2018-03-31 DIAGNOSIS — I10 Essential (primary) hypertension: Secondary | ICD-10-CM

## 2018-03-31 MED ORDER — ZOLPIDEM TARTRATE 10 MG PO TABS: 10.0000 mg | ORAL_TABLET | Freq: Every day | ORAL | 1 refills | Status: DC

## 2018-03-31 NOTE — Progress Notes (Signed)
   Subjective:    Patient ID: Dale Watkins., male    DOB: [birth year], 62 y.o.   MRN: [id number]  HPI He is here for an interval evaluation.  He gets most of his care at the ID clinic in [place].  Presently he is switched to a new HIV med, Triumeg he is also taking lisinopril and chlorthalidone.  They are following up on both of these.  I am mainly taking care of his sleep disturbance which he seems to be doing fairly well.  He has no other concerns or complaints.   Review of Systems     Objective:   Physical Exam Alert and in no distress otherwise not examined       Assessment & Plan:  HIV positive ([place])  Essential hypertension  Chronic insomnia

## 2018-05-21 ENCOUNTER — Other Ambulatory Visit: Payer: Self-pay | Admitting: Internal Medicine

## 2018-05-22 ENCOUNTER — Other Ambulatory Visit: Payer: Self-pay | Admitting: Internal Medicine

## 2018-05-22 DIAGNOSIS — M545 Low back pain, unspecified: Secondary | ICD-10-CM

## 2018-05-22 DIAGNOSIS — G8929 Other chronic pain: Secondary | ICD-10-CM

## 2018-05-30 ENCOUNTER — Ambulatory Visit
Admission: RE | Admit: 2018-05-30 | Discharge: 2018-05-30 | Disposition: A | Discharge status: Home / Self Care | Payer: BLUE CROSS/BLUE SHIELD | Source: Ambulatory Visit | Attending: Internal Medicine | Admitting: Internal Medicine

## 2018-05-30 DIAGNOSIS — G8929 Other chronic pain: Secondary | ICD-10-CM

## 2018-05-30 DIAGNOSIS — M545 Low back pain, unspecified: Secondary | ICD-10-CM

## 2018-05-30 MED ORDER — IOPAMIDOL (ISOVUE-M 200) INJECTION 41%
1.0000 mL | Freq: Once | INTRAMUSCULAR | Status: AC
  Administered 2018-05-30: 1 mL via EPIDURAL

## 2018-05-30 MED ORDER — METHYLPREDNISOLONE ACETATE 40 MG/ML INJ SUSP (RADIOLOG
120.0000 mg | Freq: Once | INTRAMUSCULAR | Status: AC
  Administered 2018-05-30: 120 mg via EPIDURAL

## 2018-07-01 ENCOUNTER — Encounter: Payer: Self-pay | Admitting: Family Medicine

## 2018-07-01 ENCOUNTER — Ambulatory Visit: Payer: BLUE CROSS/BLUE SHIELD | Admitting: Family Medicine

## 2018-07-01 VITALS — BP 110/72 | HR 65 | Temp 98.2°F | Resp 16 | Wt 185.0 lb

## 2018-07-01 DIAGNOSIS — Z21 Asymptomatic human immunodeficiency virus [HIV] infection status: Secondary | ICD-10-CM | POA: Diagnosis not present

## 2018-07-01 DIAGNOSIS — B9789 Other viral agents as the cause of diseases classified elsewhere: Secondary | ICD-10-CM

## 2018-07-01 DIAGNOSIS — J069 Acute upper respiratory infection, unspecified: Secondary | ICD-10-CM

## 2018-07-01 NOTE — Progress Notes (Signed)
   Subjective:    Patient ID: Dale Watkins., male    DOB: 29-Oct-1956, 62 y.o.   MRN: [id number]  HPI He notes the onset of a dry cough last Wednesday followed by productive cough the next day but no fever, chills, sore throat or earache.  He states that he is feeling slightly better today.  He continues on his HIV meds and was recently seen for this.   Review of Systems     Objective:   Physical Exam Alert and in no distress. Tympanic membranes and canals are normal. Pharyngeal area is normal. Neck is supple without adenopathy or thyromegaly. Cardiac exam shows a regular sinus rhythm without murmurs or gallops. Lungs are clear to auscultation.        Assessment & Plan:  HIV positive ([place])  Viral URI with cough I explained that I thought this was viral and was about to run its course.  He will continue to treat this symptomatically however if his symptoms worsen, he will call and I will call in an antibiotic. Also discussed his immunizations in regard to the pneumonia, shingles and flu shot.  He will set this up at a later date.

## 2018-09-20 ENCOUNTER — Other Ambulatory Visit: Payer: Self-pay | Admitting: Family Medicine

## 2018-09-20 DIAGNOSIS — F5104 Psychophysiologic insomnia: Secondary | ICD-10-CM

## 2018-09-22 NOTE — Telephone Encounter (Signed)
Is this okay to refill? 

## 2018-09-24 ENCOUNTER — Other Ambulatory Visit: Payer: Self-pay | Admitting: Internal Medicine

## 2018-09-24 DIAGNOSIS — M545 Low back pain, unspecified: Secondary | ICD-10-CM

## 2018-09-24 DIAGNOSIS — G8929 Other chronic pain: Secondary | ICD-10-CM

## 2018-10-07 ENCOUNTER — Ambulatory Visit
Admission: RE | Admit: 2018-10-07 | Discharge: 2018-10-07 | Disposition: A | Discharge status: Home / Self Care | Payer: BLUE CROSS/BLUE SHIELD | Source: Ambulatory Visit | Attending: Internal Medicine | Admitting: Internal Medicine

## 2018-10-07 DIAGNOSIS — M545 Low back pain, unspecified: Secondary | ICD-10-CM

## 2018-10-07 DIAGNOSIS — G8929 Other chronic pain: Secondary | ICD-10-CM

## 2018-10-07 MED ORDER — IOPAMIDOL (ISOVUE-M 200) INJECTION 41%
1.0000 mL | Freq: Once | INTRAMUSCULAR | Status: AC
  Administered 2018-10-07: 1 mL via EPIDURAL

## 2018-10-07 MED ORDER — METHYLPREDNISOLONE ACETATE 40 MG/ML INJ SUSP (RADIOLOG
120.0000 mg | Freq: Once | INTRAMUSCULAR | Status: AC
  Administered 2018-10-07: 120 mg via EPIDURAL

## 2019-01-15 ENCOUNTER — Other Ambulatory Visit: Payer: Self-pay | Admitting: Internal Medicine

## 2019-01-15 DIAGNOSIS — M545 Low back pain, unspecified: Secondary | ICD-10-CM

## 2019-01-15 DIAGNOSIS — G8929 Other chronic pain: Secondary | ICD-10-CM

## 2019-10-26 ENCOUNTER — Other Ambulatory Visit: Payer: Self-pay | Admitting: Internal Medicine

## 2019-10-26 DIAGNOSIS — G8929 Other chronic pain: Secondary | ICD-10-CM

## 2019-10-26 DIAGNOSIS — M545 Low back pain, unspecified: Secondary | ICD-10-CM

## 2019-11-02 ENCOUNTER — Other Ambulatory Visit: Payer: BLUE CROSS/BLUE SHIELD

## 2019-11-27 ENCOUNTER — Ambulatory Visit
Admission: RE | Admit: 2019-11-27 | Discharge: 2019-11-27 | Disposition: A | Discharge status: Home / Self Care | Payer: BLUE CROSS/BLUE SHIELD | Source: Ambulatory Visit | Attending: Internal Medicine | Admitting: Internal Medicine

## 2019-11-27 DIAGNOSIS — M545 Low back pain, unspecified: Secondary | ICD-10-CM

## 2019-11-27 DIAGNOSIS — G8929 Other chronic pain: Secondary | ICD-10-CM

## 2019-11-27 MED ORDER — METHYLPREDNISOLONE ACETATE 40 MG/ML INJ SUSP (RADIOLOG
120.0000 mg | Freq: Once | INTRAMUSCULAR | Status: AC
  Administered 2019-11-27: 12:00:00 120 mg via EPIDURAL

## 2019-11-27 MED ORDER — IOPAMIDOL (ISOVUE-M 200) INJECTION 41%
1.0000 mL | Freq: Once | INTRAMUSCULAR | Status: AC
  Administered 2019-11-27: 1 mL via EPIDURAL

## 2019-11-27 NOTE — Discharge Instructions (Signed)

## 2020-03-28 ENCOUNTER — Other Ambulatory Visit: Payer: Self-pay | Admitting: Internal Medicine

## 2020-03-28 DIAGNOSIS — M545 Low back pain, unspecified: Secondary | ICD-10-CM

## 2020-04-15 ENCOUNTER — Ambulatory Visit
Admission: RE | Admit: 2020-04-15 | Discharge: 2020-04-15 | Disposition: A | Discharge status: Home / Self Care | Payer: BLUE CROSS/BLUE SHIELD | Source: Ambulatory Visit | Attending: Internal Medicine | Admitting: Internal Medicine

## 2020-04-15 ENCOUNTER — Other Ambulatory Visit: Payer: Self-pay

## 2020-04-15 DIAGNOSIS — M545 Low back pain, unspecified: Secondary | ICD-10-CM

## 2020-04-15 MED ORDER — METHYLPREDNISOLONE ACETATE 40 MG/ML INJ SUSP (RADIOLOG
120.0000 mg | Freq: Once | INTRAMUSCULAR | Status: AC
  Administered 2020-04-15: 120 mg via EPIDURAL

## 2020-04-15 MED ORDER — IOPAMIDOL (ISOVUE-M 200) INJECTION 41%
1.0000 mL | Freq: Once | INTRAMUSCULAR | Status: AC
  Administered 2020-04-15: 1 mL via EPIDURAL

## 2020-04-15 NOTE — Discharge Instructions (Signed)

## 2020-09-09 ENCOUNTER — Other Ambulatory Visit: Payer: Self-pay | Admitting: Family Medicine

## 2020-09-09 ENCOUNTER — Other Ambulatory Visit: Payer: Self-pay | Admitting: Nurse Practitioner

## 2020-09-09 ENCOUNTER — Other Ambulatory Visit: Payer: Self-pay

## 2020-09-09 DIAGNOSIS — M545 Low back pain, unspecified: Secondary | ICD-10-CM

## 2020-09-23 ENCOUNTER — Ambulatory Visit
Admission: RE | Admit: 2020-09-23 | Discharge: 2020-09-23 | Disposition: A | Discharge status: Home / Self Care | Payer: BLUE CROSS/BLUE SHIELD | Source: Ambulatory Visit | Attending: Family Medicine | Admitting: Family Medicine

## 2020-09-23 ENCOUNTER — Other Ambulatory Visit: Payer: Self-pay | Admitting: Family Medicine

## 2020-09-23 ENCOUNTER — Other Ambulatory Visit: Payer: Self-pay

## 2020-09-23 DIAGNOSIS — M545 Low back pain, unspecified: Secondary | ICD-10-CM

## 2020-09-23 MED ORDER — IOPAMIDOL (ISOVUE-M 200) INJECTION 41%
1.0000 mL | Freq: Once | INTRAMUSCULAR | Status: AC
  Administered 2020-09-23: 1 mL via EPIDURAL

## 2020-09-23 MED ORDER — METHYLPREDNISOLONE ACETATE 40 MG/ML INJ SUSP (RADIOLOG
120.0000 mg | Freq: Once | INTRAMUSCULAR | Status: AC
  Administered 2020-09-23: 120 mg via EPIDURAL

## 2020-09-23 NOTE — Discharge Instructions (Signed)

## 2021-03-08 ENCOUNTER — Other Ambulatory Visit: Payer: Self-pay | Admitting: Family Medicine

## 2021-03-08 DIAGNOSIS — G8929 Other chronic pain: Secondary | ICD-10-CM

## 2021-03-08 DIAGNOSIS — M545 Low back pain, unspecified: Secondary | ICD-10-CM

## 2021-03-13 ENCOUNTER — Other Ambulatory Visit: Payer: Self-pay

## 2021-03-13 ENCOUNTER — Ambulatory Visit
Admission: RE | Admit: 2021-03-13 | Discharge: 2021-03-13 | Disposition: A | Discharge status: Home / Self Care | Payer: BLUE CROSS/BLUE SHIELD | Source: Ambulatory Visit | Attending: Family Medicine | Admitting: Family Medicine

## 2021-03-13 DIAGNOSIS — M545 Low back pain, unspecified: Secondary | ICD-10-CM

## 2021-03-13 DIAGNOSIS — G8929 Other chronic pain: Secondary | ICD-10-CM

## 2021-03-13 MED ORDER — IOPAMIDOL (ISOVUE-M 200) INJECTION 41%
1.0000 mL | Freq: Once | INTRAMUSCULAR | Status: AC
  Administered 2021-03-13: 1 mL via EPIDURAL

## 2021-03-13 MED ORDER — METHYLPREDNISOLONE ACETATE 40 MG/ML INJ SUSP (RADIOLOG
120.0000 mg | Freq: Once | INTRAMUSCULAR | Status: AC
  Administered 2021-03-13: 120 mg via EPIDURAL

## 2021-03-13 NOTE — Discharge Instructions (Signed)

## 2021-04-03 ENCOUNTER — Other Ambulatory Visit: Payer: BLUE CROSS/BLUE SHIELD

## 2022-03-07 ENCOUNTER — Other Ambulatory Visit: Payer: Self-pay | Admitting: Family Medicine

## 2022-03-07 DIAGNOSIS — M25552 Pain in left hip: Secondary | ICD-10-CM

## 2022-03-28 ENCOUNTER — Ambulatory Visit
Admission: RE | Admit: 2022-03-28 | Discharge: 2022-03-28 | Disposition: A | Discharge status: Home / Self Care | Payer: No Typology Code available for payment source | Source: Ambulatory Visit | Attending: Family Medicine | Admitting: Family Medicine

## 2022-03-28 DIAGNOSIS — M25552 Pain in left hip: Secondary | ICD-10-CM

## 2022-03-28 MED ORDER — METHYLPREDNISOLONE ACETATE 40 MG/ML INJ SUSP (RADIOLOG
80.0000 mg | Freq: Once | INTRAMUSCULAR | Status: AC
  Administered 2022-03-28: 80 mg via INTRA_ARTICULAR

## 2022-03-28 MED ORDER — IOPAMIDOL (ISOVUE-M 200) INJECTION 41%
1.0000 mL | Freq: Once | INTRAMUSCULAR | Status: AC
  Administered 2022-03-28: 1 mL via INTRA_ARTICULAR
